# Patient Record
Sex: Male | Born: 1935 | Race: White | Hispanic: No | Marital: Married | Smoking: Never smoker
Health system: Southern US, Community
[De-identification: ages and names within clinical notes are randomized; demographics above are authoritative.]

## PROBLEM LIST (undated history)

## (undated) DIAGNOSIS — I509 Heart failure, unspecified: Secondary | ICD-10-CM

## (undated) DIAGNOSIS — F32A Depression, unspecified: Secondary | ICD-10-CM

## (undated) DIAGNOSIS — I779 Disorder of arteries and arterioles, unspecified: Secondary | ICD-10-CM

## (undated) DIAGNOSIS — T4145XA Adverse effect of unspecified anesthetic, initial encounter: Secondary | ICD-10-CM

## (undated) DIAGNOSIS — G4730 Sleep apnea, unspecified: Secondary | ICD-10-CM

## (undated) DIAGNOSIS — M1990 Unspecified osteoarthritis, unspecified site: Secondary | ICD-10-CM

## (undated) DIAGNOSIS — N2581 Secondary hyperparathyroidism of renal origin: Secondary | ICD-10-CM

## (undated) DIAGNOSIS — I10 Essential (primary) hypertension: Secondary | ICD-10-CM

## (undated) DIAGNOSIS — I350 Nonrheumatic aortic (valve) stenosis: Secondary | ICD-10-CM

## (undated) DIAGNOSIS — D509 Iron deficiency anemia, unspecified: Secondary | ICD-10-CM

## (undated) DIAGNOSIS — F329 Major depressive disorder, single episode, unspecified: Secondary | ICD-10-CM

## (undated) DIAGNOSIS — I2510 Atherosclerotic heart disease of native coronary artery without angina pectoris: Secondary | ICD-10-CM

## (undated) DIAGNOSIS — T8859XA Other complications of anesthesia, initial encounter: Secondary | ICD-10-CM

## (undated) DIAGNOSIS — J189 Pneumonia, unspecified organism: Secondary | ICD-10-CM

## (undated) DIAGNOSIS — I480 Paroxysmal atrial fibrillation: Secondary | ICD-10-CM

## (undated) DIAGNOSIS — K219 Gastro-esophageal reflux disease without esophagitis: Secondary | ICD-10-CM

## (undated) DIAGNOSIS — Z992 Dependence on renal dialysis: Secondary | ICD-10-CM

## (undated) DIAGNOSIS — N2889 Other specified disorders of kidney and ureter: Secondary | ICD-10-CM

## (undated) DIAGNOSIS — I739 Peripheral vascular disease, unspecified: Secondary | ICD-10-CM

## (undated) DIAGNOSIS — K659 Peritonitis, unspecified: Secondary | ICD-10-CM

## (undated) DIAGNOSIS — D72829 Elevated white blood cell count, unspecified: Secondary | ICD-10-CM

## (undated) DIAGNOSIS — E871 Hypo-osmolality and hyponatremia: Secondary | ICD-10-CM

## (undated) DIAGNOSIS — N186 End stage renal disease: Secondary | ICD-10-CM

## (undated) DIAGNOSIS — K224 Dyskinesia of esophagus: Secondary | ICD-10-CM

## (undated) DIAGNOSIS — I351 Nonrheumatic aortic (valve) insufficiency: Secondary | ICD-10-CM

## (undated) DIAGNOSIS — I6529 Occlusion and stenosis of unspecified carotid artery: Secondary | ICD-10-CM

## (undated) DIAGNOSIS — N200 Calculus of kidney: Secondary | ICD-10-CM

## (undated) HISTORY — DX: Gastro-esophageal reflux disease without esophagitis: K21.9

## (undated) HISTORY — DX: Major depressive disorder, single episode, unspecified: F32.9

## (undated) HISTORY — DX: End stage renal disease: N18.6

## (undated) HISTORY — DX: Essential (primary) hypertension: I10

## (undated) HISTORY — DX: Hypo-osmolality and hyponatremia: E87.1

## (undated) HISTORY — PX: OTHER SURGICAL HISTORY: SHX169

## (undated) HISTORY — DX: Elevated white blood cell count, unspecified: D72.829

## (undated) HISTORY — DX: Iron deficiency anemia, unspecified: D50.9

## (undated) HISTORY — DX: Depression, unspecified: F32.A

---

## 1999-04-27 ENCOUNTER — Encounter: Admission: RE | Admit: 1999-04-27 | Discharge: 1999-04-27 | Payer: Self-pay | Admitting: *Deleted

## 2003-01-01 ENCOUNTER — Other Ambulatory Visit: Admission: RE | Admit: 2003-01-01 | Discharge: 2003-01-01 | Payer: Self-pay | Admitting: Oncology

## 2003-02-21 ENCOUNTER — Ambulatory Visit (HOSPITAL_COMMUNITY): Admission: RE | Admit: 2003-02-21 | Discharge: 2003-02-21 | Payer: Self-pay | Admitting: Nephrology

## 2003-03-28 ENCOUNTER — Ambulatory Visit (HOSPITAL_COMMUNITY): Admission: RE | Admit: 2003-03-28 | Discharge: 2003-03-28 | Payer: Self-pay | Admitting: Nephrology

## 2003-04-28 ENCOUNTER — Ambulatory Visit (HOSPITAL_COMMUNITY): Admission: RE | Admit: 2003-04-28 | Discharge: 2003-04-28 | Payer: Self-pay | Admitting: *Deleted

## 2003-05-30 ENCOUNTER — Encounter: Admission: RE | Admit: 2003-05-30 | Discharge: 2003-05-30 | Payer: Self-pay | Admitting: General Surgery

## 2003-06-13 ENCOUNTER — Inpatient Hospital Stay (HOSPITAL_COMMUNITY): Admission: AD | Admit: 2003-06-13 | Discharge: 2003-06-18 | Payer: Self-pay | Admitting: General Surgery

## 2003-07-18 ENCOUNTER — Ambulatory Visit (HOSPITAL_COMMUNITY): Admission: RE | Admit: 2003-07-18 | Discharge: 2003-07-18 | Payer: Self-pay | Admitting: Vascular Surgery

## 2003-07-24 ENCOUNTER — Ambulatory Visit (HOSPITAL_COMMUNITY): Admission: RE | Admit: 2003-07-24 | Discharge: 2003-07-24 | Payer: Self-pay | Admitting: Cardiovascular Disease

## 2004-07-30 ENCOUNTER — Encounter: Admission: RE | Admit: 2004-07-30 | Discharge: 2004-07-30 | Payer: Self-pay | Admitting: Nephrology

## 2004-08-13 ENCOUNTER — Ambulatory Visit: Payer: Self-pay | Admitting: Internal Medicine

## 2004-08-31 ENCOUNTER — Ambulatory Visit: Payer: Self-pay | Admitting: Internal Medicine

## 2004-09-15 ENCOUNTER — Ambulatory Visit: Payer: Self-pay | Admitting: Internal Medicine

## 2004-09-23 ENCOUNTER — Encounter: Admission: RE | Admit: 2004-09-23 | Discharge: 2004-09-23 | Payer: Self-pay | Admitting: Nephrology

## 2005-02-01 ENCOUNTER — Encounter: Payer: Self-pay | Admitting: Cardiology

## 2005-02-01 ENCOUNTER — Ambulatory Visit: Payer: Self-pay

## 2005-04-29 ENCOUNTER — Ambulatory Visit: Payer: Self-pay | Admitting: Cardiovascular Disease

## 2005-12-21 ENCOUNTER — Ambulatory Visit: Payer: Self-pay | Admitting: Internal Medicine

## 2005-12-29 ENCOUNTER — Encounter: Payer: Self-pay | Admitting: Internal Medicine

## 2005-12-29 ENCOUNTER — Ambulatory Visit (HOSPITAL_COMMUNITY): Admission: RE | Admit: 2005-12-29 | Discharge: 2005-12-29 | Payer: Self-pay | Admitting: Internal Medicine

## 2006-01-02 ENCOUNTER — Ambulatory Visit: Payer: Self-pay | Admitting: Internal Medicine

## 2006-01-25 ENCOUNTER — Ambulatory Visit: Payer: Self-pay | Admitting: Internal Medicine

## 2007-05-02 ENCOUNTER — Emergency Department (HOSPITAL_COMMUNITY): Admission: EM | Admit: 2007-05-02 | Discharge: 2007-05-02 | Payer: Self-pay | Admitting: Emergency Medicine

## 2007-05-05 ENCOUNTER — Encounter: Admission: RE | Admit: 2007-05-05 | Discharge: 2007-05-05 | Payer: Self-pay | Admitting: Nephrology

## 2007-09-23 ENCOUNTER — Inpatient Hospital Stay (HOSPITAL_COMMUNITY): Admission: AD | Admit: 2007-09-23 | Discharge: 2007-09-28 | Payer: Self-pay | Admitting: Nephrology

## 2007-10-22 ENCOUNTER — Ambulatory Visit: Payer: Self-pay | Admitting: Surgery

## 2007-10-26 ENCOUNTER — Ambulatory Visit (HOSPITAL_COMMUNITY): Admission: RE | Admit: 2007-10-26 | Discharge: 2007-10-26 | Payer: Self-pay | Admitting: Nephrology

## 2007-10-29 ENCOUNTER — Emergency Department (HOSPITAL_COMMUNITY): Admission: EM | Admit: 2007-10-29 | Discharge: 2007-10-30 | Payer: Self-pay | Admitting: Emergency Medicine

## 2007-10-29 ENCOUNTER — Ambulatory Visit (HOSPITAL_COMMUNITY): Admission: RE | Admit: 2007-10-29 | Discharge: 2007-10-29 | Payer: Self-pay | Admitting: Nephrology

## 2007-10-31 ENCOUNTER — Encounter: Payer: Self-pay | Admitting: Nephrology

## 2007-11-02 ENCOUNTER — Ambulatory Visit (HOSPITAL_COMMUNITY): Admission: RE | Admit: 2007-11-02 | Discharge: 2007-11-02 | Payer: Self-pay | Admitting: Nephrology

## 2007-11-07 ENCOUNTER — Encounter: Admission: RE | Admit: 2007-11-07 | Discharge: 2007-11-07 | Payer: Self-pay | Admitting: Interventional Radiology

## 2007-11-12 ENCOUNTER — Ambulatory Visit: Payer: Self-pay | Admitting: Vascular Surgery

## 2007-11-12 ENCOUNTER — Ambulatory Visit (HOSPITAL_COMMUNITY): Admission: RE | Admit: 2007-11-12 | Discharge: 2007-11-12 | Payer: Self-pay | Admitting: Vascular Surgery

## 2007-11-16 ENCOUNTER — Ambulatory Visit: Payer: Self-pay | Admitting: Internal Medicine

## 2007-11-16 DIAGNOSIS — K224 Dyskinesia of esophagus: Secondary | ICD-10-CM

## 2007-11-26 ENCOUNTER — Encounter (INDEPENDENT_AMBULATORY_CARE_PROVIDER_SITE_OTHER): Payer: Self-pay

## 2007-11-26 ENCOUNTER — Ambulatory Visit (HOSPITAL_COMMUNITY): Admission: RE | Admit: 2007-11-26 | Discharge: 2007-11-26 | Payer: Self-pay | Admitting: Internal Medicine

## 2007-11-26 ENCOUNTER — Ambulatory Visit: Payer: Self-pay | Admitting: Internal Medicine

## 2007-11-28 ENCOUNTER — Ambulatory Visit: Payer: Self-pay | Admitting: Vascular Surgery

## 2007-12-19 ENCOUNTER — Ambulatory Visit: Payer: Self-pay | Admitting: Vascular Surgery

## 2008-01-16 ENCOUNTER — Ambulatory Visit: Payer: Self-pay | Admitting: Vascular Surgery

## 2008-02-15 ENCOUNTER — Ambulatory Visit: Payer: Self-pay | Admitting: Surgery

## 2008-02-15 ENCOUNTER — Ambulatory Visit (HOSPITAL_COMMUNITY): Admission: RE | Admit: 2008-02-15 | Discharge: 2008-02-15 | Payer: Self-pay | Admitting: Vascular Surgery

## 2008-04-07 ENCOUNTER — Emergency Department (HOSPITAL_COMMUNITY): Admission: EM | Admit: 2008-04-07 | Discharge: 2008-04-07 | Payer: Self-pay | Admitting: Emergency Medicine

## 2008-04-07 ENCOUNTER — Encounter (INDEPENDENT_AMBULATORY_CARE_PROVIDER_SITE_OTHER): Payer: Self-pay | Admitting: Emergency Medicine

## 2008-04-07 ENCOUNTER — Ambulatory Visit: Payer: Self-pay | Admitting: Vascular Surgery

## 2008-04-14 ENCOUNTER — Ambulatory Visit (HOSPITAL_COMMUNITY): Admission: RE | Admit: 2008-04-14 | Discharge: 2008-04-14 | Payer: Self-pay | Admitting: Nephrology

## 2008-05-26 ENCOUNTER — Ambulatory Visit: Payer: Self-pay | Admitting: Cardiovascular Disease

## 2008-05-26 ENCOUNTER — Inpatient Hospital Stay (HOSPITAL_COMMUNITY): Admission: EM | Admit: 2008-05-26 | Discharge: 2008-05-29 | Payer: Self-pay | Admitting: Emergency Medicine

## 2008-05-26 ENCOUNTER — Ambulatory Visit: Payer: Self-pay | Admitting: Cardiology

## 2008-05-27 ENCOUNTER — Encounter (INDEPENDENT_AMBULATORY_CARE_PROVIDER_SITE_OTHER): Payer: Self-pay | Admitting: Internal Medicine

## 2008-05-30 ENCOUNTER — Encounter: Payer: Self-pay | Admitting: Cardiology

## 2008-10-06 ENCOUNTER — Ambulatory Visit (HOSPITAL_COMMUNITY): Admission: RE | Admit: 2008-10-06 | Discharge: 2008-10-06 | Payer: Self-pay | Admitting: Nephrology

## 2008-12-22 IMAGING — CT CT HEAD W/O CM
1 series · 16 of 28 positions shown, 20 images · IV contrast (agent unspecified)
Comparison: None.

CLINICAL DATA: Dizziness.  Loss of balance.  
 HEAD CT WITHOUT CONTRAST:
TECHNIQUE: Contiguous axial images were obtained from the base of the skull through the vertex according to standard protocol without contrast.

[Series 2: head · axial · 0.49mm/px · z∈[+80,+212]mm · 16 of 28 slices shown, 20 images]
[im 2/28  brain]
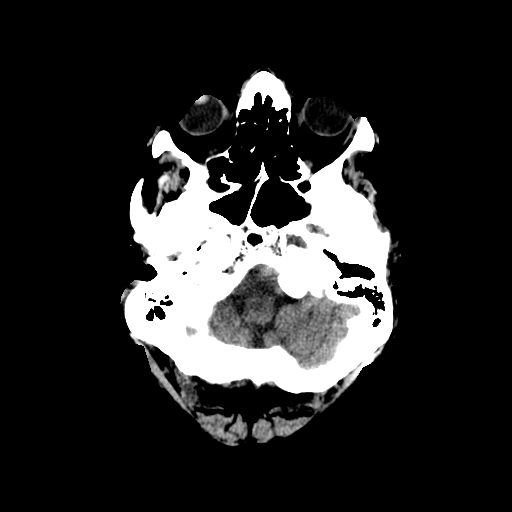
[im 2/28  bone]
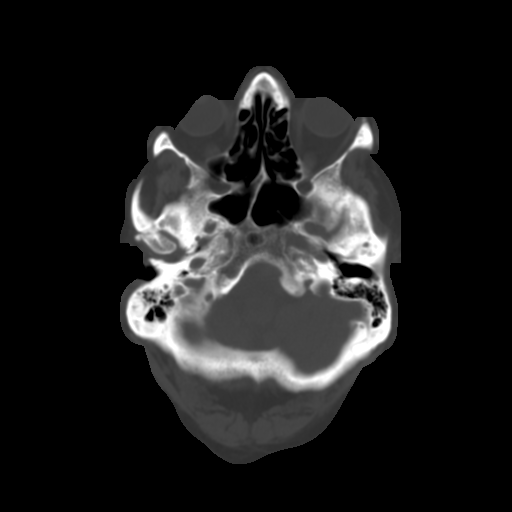
[im 4/28  brain]
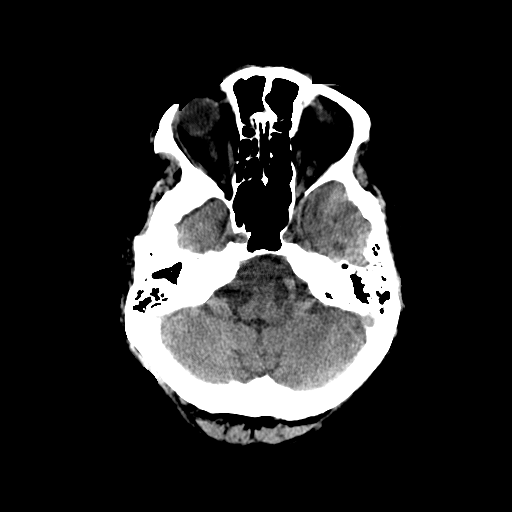
[im 6/28  brain]
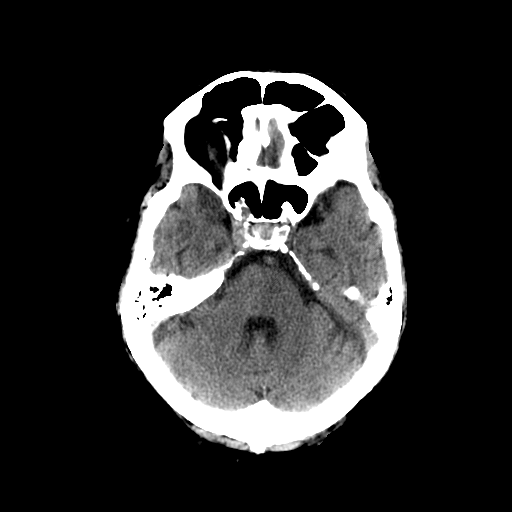
[im 7/28  brain]
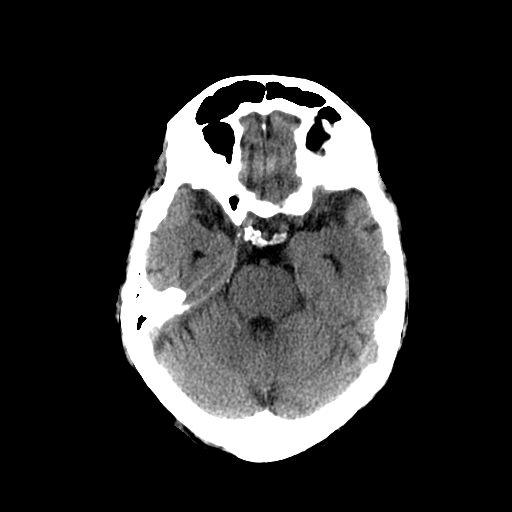
[im 9/28  brain]
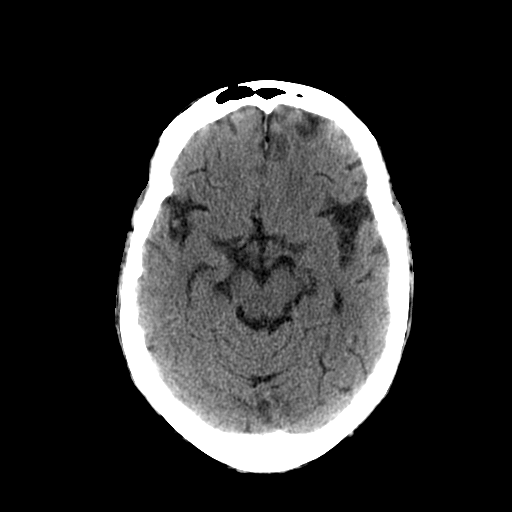
[im 9/28  bone]
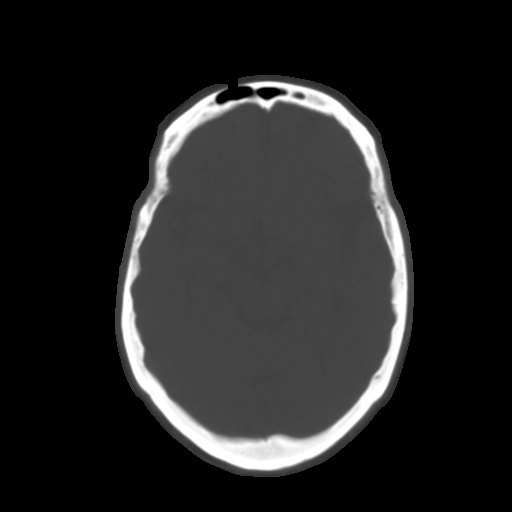
[im 10/28  brain]
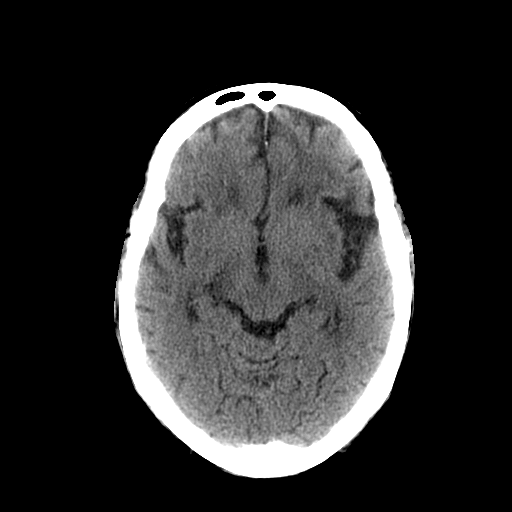
[im 12/28  brain]
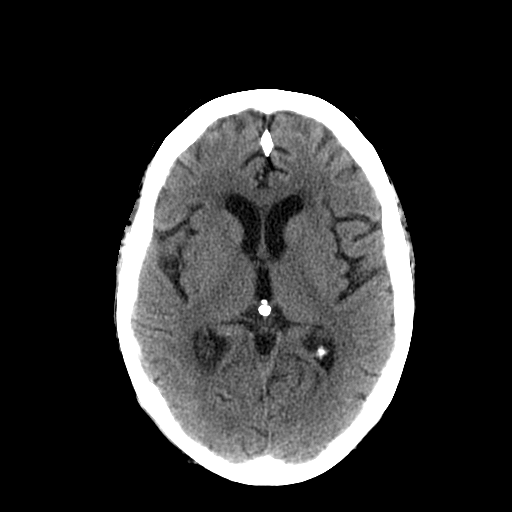
[im 14/28  brain]
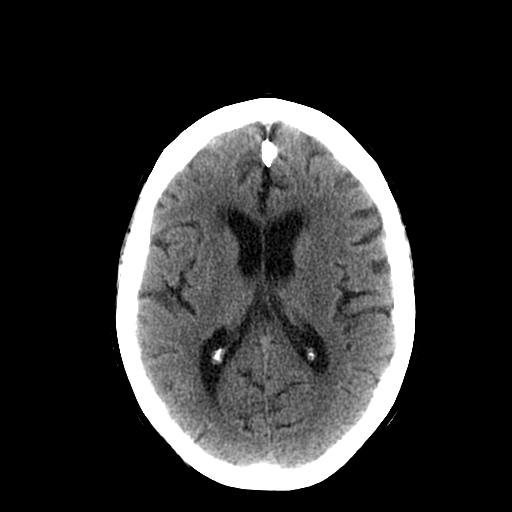
[im 15/28  brain]
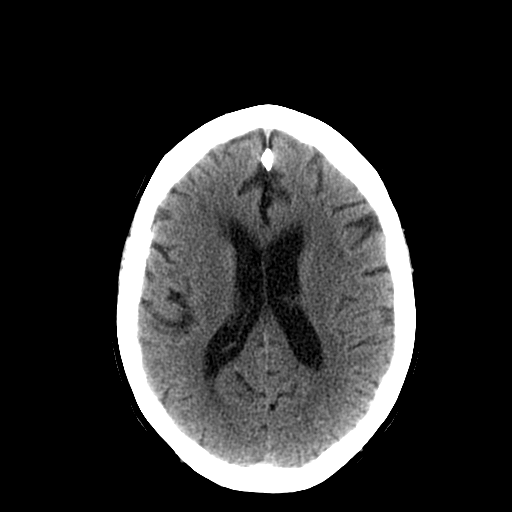
[im 15/28  bone]
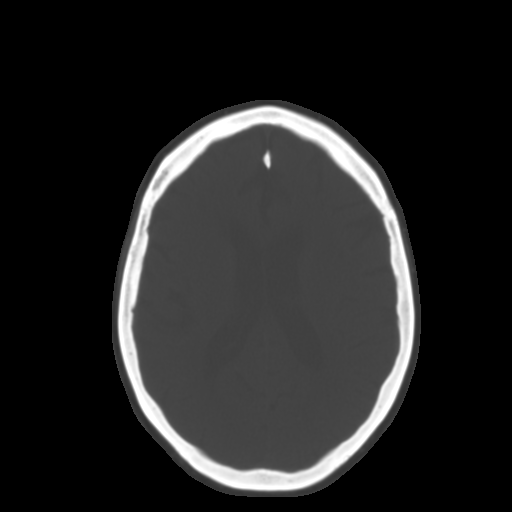
[im 17/28  brain]
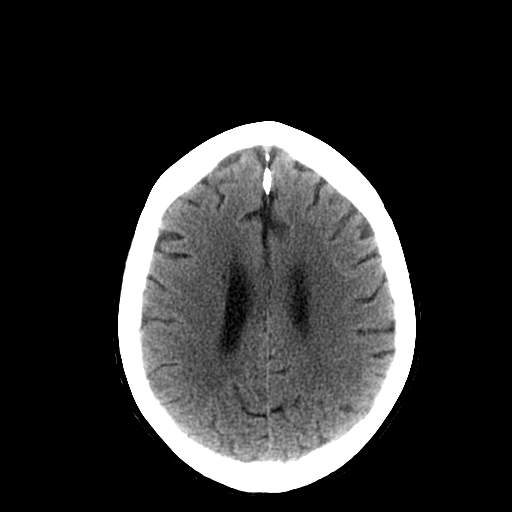
[im 19/28  brain]
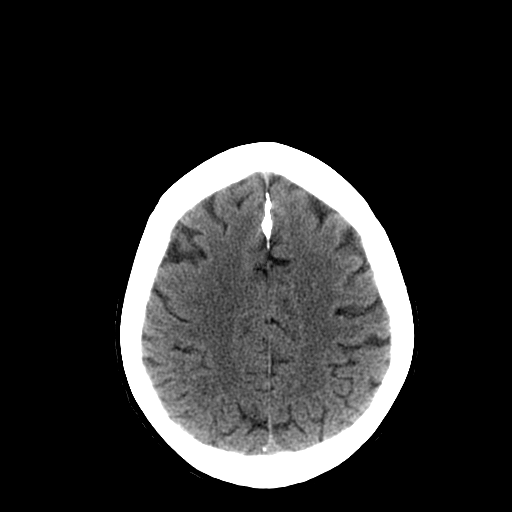
[im 20/28  brain]
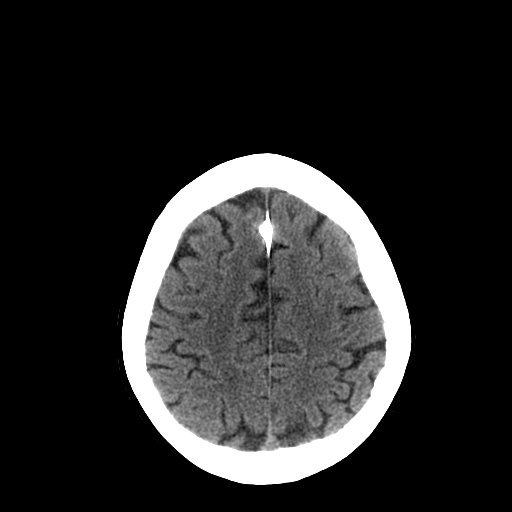
[im 22/28  brain]
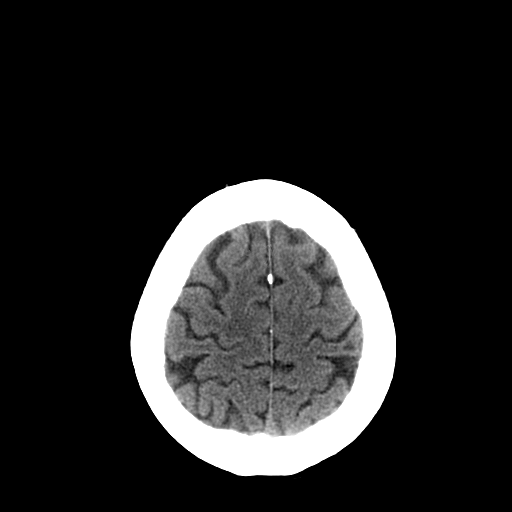
[im 22/28  bone]
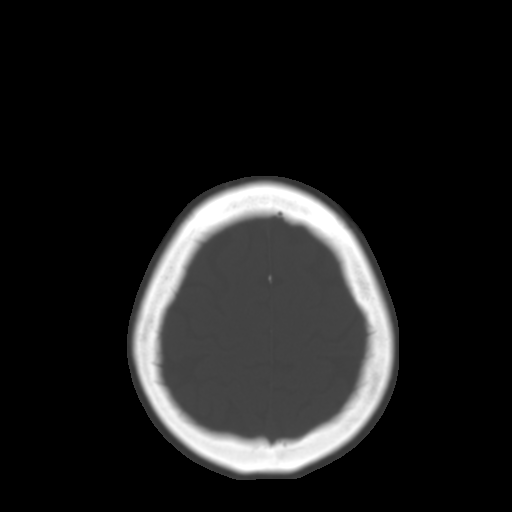
[im 23/28  brain]
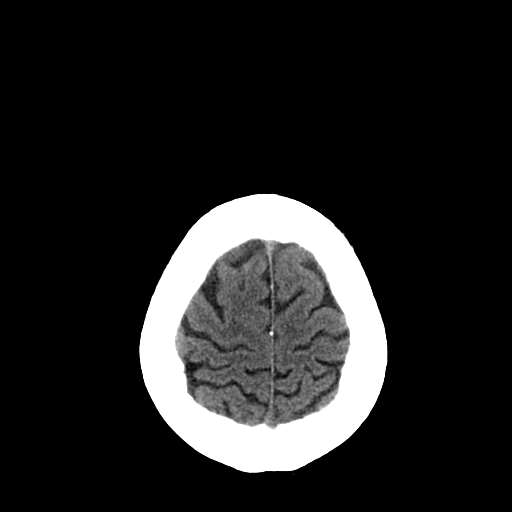
[im 25/28  brain]
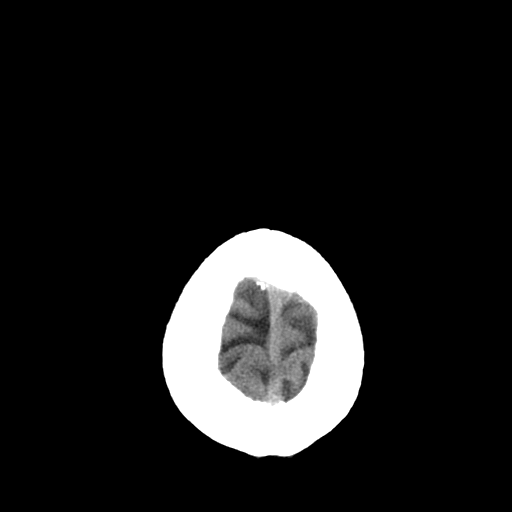
[im 27/28  brain]
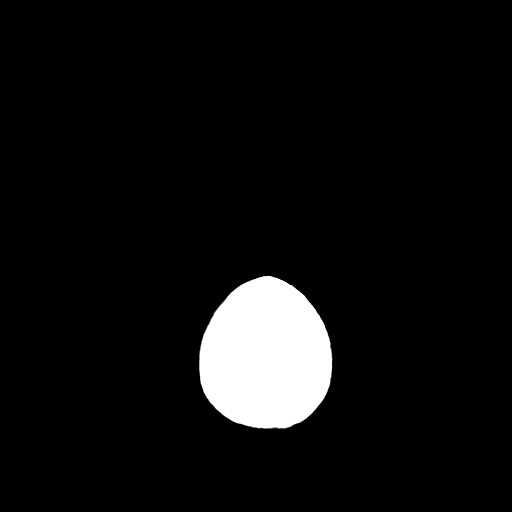

[16 of 28 positions shown; findings below may reference images not displayed]

FINDINGS: Age related atrophy with periventricular low attenuation attributed to small vessel ischemic change.  Carotid and vertebral arterial calcifications are noted.  There is no acute intracranial hemorrhage or edema.   There are no mass lesions or evidence for mass effect.  Mastoid air cells and sinuses are clear.
IMPRESSION: Vascular calcifications.  Mild atrophy with small vessel ischemic change.  No acute intracranial hemorrhage or edema.

## 2009-03-18 ENCOUNTER — Encounter: Admission: RE | Admit: 2009-03-18 | Discharge: 2009-03-18 | Payer: Self-pay | Admitting: Nephrology

## 2009-08-03 ENCOUNTER — Ambulatory Visit (HOSPITAL_COMMUNITY): Admission: RE | Admit: 2009-08-03 | Discharge: 2009-08-03 | Payer: Self-pay | Admitting: Nephrology

## 2009-09-08 ENCOUNTER — Encounter: Payer: Self-pay | Admitting: Cardiology

## 2009-09-10 ENCOUNTER — Ambulatory Visit: Payer: Self-pay | Admitting: Cardiology

## 2009-09-10 DIAGNOSIS — I4891 Unspecified atrial fibrillation: Secondary | ICD-10-CM

## 2009-09-10 DIAGNOSIS — I6529 Occlusion and stenosis of unspecified carotid artery: Secondary | ICD-10-CM

## 2009-09-10 DIAGNOSIS — I70219 Atherosclerosis of native arteries of extremities with intermittent claudication, unspecified extremity: Secondary | ICD-10-CM | POA: Insufficient documentation

## 2009-09-10 DIAGNOSIS — I5032 Chronic diastolic (congestive) heart failure: Secondary | ICD-10-CM

## 2009-09-10 DIAGNOSIS — I2510 Atherosclerotic heart disease of native coronary artery without angina pectoris: Secondary | ICD-10-CM

## 2009-09-17 ENCOUNTER — Ambulatory Visit: Payer: Self-pay | Admitting: Internal Medicine

## 2009-09-17 LAB — CONVERTED CEMR LAB: POC INR: 1

## 2009-09-22 ENCOUNTER — Encounter: Payer: Self-pay | Admitting: Cardiology

## 2009-09-22 ENCOUNTER — Telehealth: Payer: Self-pay | Admitting: Cardiology

## 2009-09-25 ENCOUNTER — Ambulatory Visit (HOSPITAL_COMMUNITY): Admission: RE | Admit: 2009-09-25 | Discharge: 2009-09-25 | Payer: Self-pay | Admitting: Cardiology

## 2009-09-25 ENCOUNTER — Encounter: Payer: Self-pay | Admitting: Cardiology

## 2009-09-25 ENCOUNTER — Ambulatory Visit: Payer: Self-pay

## 2009-09-25 ENCOUNTER — Ambulatory Visit: Payer: Self-pay | Admitting: Cardiovascular Disease

## 2009-09-25 ENCOUNTER — Ambulatory Visit: Payer: Self-pay | Admitting: Cardiology

## 2009-09-25 LAB — CONVERTED CEMR LAB: POC INR: 4.5

## 2009-10-01 ENCOUNTER — Ambulatory Visit: Payer: Self-pay | Admitting: Cardiology

## 2009-10-03 ENCOUNTER — Encounter (INDEPENDENT_AMBULATORY_CARE_PROVIDER_SITE_OTHER): Payer: Self-pay | Admitting: Pharmacist

## 2009-10-08 ENCOUNTER — Encounter: Payer: Self-pay | Admitting: Internal Medicine

## 2009-10-09 LAB — CONVERTED CEMR LAB
Ferritin: 611 ng/mL
Hemoglobin: 10.5 g/dL

## 2009-10-13 ENCOUNTER — Ambulatory Visit: Payer: Self-pay | Admitting: Cardiology

## 2009-10-15 ENCOUNTER — Encounter: Payer: Self-pay | Admitting: Internal Medicine

## 2009-10-15 LAB — CONVERTED CEMR LAB: Hemoglobin: 9.4 g/dL

## 2009-10-19 ENCOUNTER — Ambulatory Visit: Payer: Self-pay | Admitting: Cardiology

## 2009-10-19 ENCOUNTER — Telehealth: Payer: Self-pay | Admitting: Cardiology

## 2009-10-19 ENCOUNTER — Ambulatory Visit: Payer: Self-pay | Admitting: Cardiovascular Disease

## 2009-10-19 DIAGNOSIS — I10 Essential (primary) hypertension: Secondary | ICD-10-CM | POA: Insufficient documentation

## 2009-10-22 ENCOUNTER — Encounter: Payer: Self-pay | Admitting: Internal Medicine

## 2009-11-03 ENCOUNTER — Encounter: Payer: Self-pay | Admitting: Internal Medicine

## 2009-11-05 ENCOUNTER — Ambulatory Visit: Payer: Self-pay | Admitting: Internal Medicine

## 2009-11-05 ENCOUNTER — Encounter: Payer: Self-pay | Admitting: Cardiology

## 2009-11-05 ENCOUNTER — Telehealth: Payer: Self-pay | Admitting: Internal Medicine

## 2009-11-06 ENCOUNTER — Encounter (INDEPENDENT_AMBULATORY_CARE_PROVIDER_SITE_OTHER): Payer: Self-pay | Admitting: *Deleted

## 2009-11-06 ENCOUNTER — Ambulatory Visit: Payer: Self-pay | Admitting: Internal Medicine

## 2009-11-06 DIAGNOSIS — R195 Other fecal abnormalities: Secondary | ICD-10-CM

## 2009-11-06 DIAGNOSIS — D649 Anemia, unspecified: Secondary | ICD-10-CM

## 2009-11-12 ENCOUNTER — Encounter (INDEPENDENT_AMBULATORY_CARE_PROVIDER_SITE_OTHER): Payer: Self-pay | Admitting: *Deleted

## 2009-11-12 ENCOUNTER — Telehealth: Payer: Self-pay | Admitting: Internal Medicine

## 2009-11-19 ENCOUNTER — Encounter (INDEPENDENT_AMBULATORY_CARE_PROVIDER_SITE_OTHER): Payer: Self-pay | Admitting: *Deleted

## 2009-11-20 ENCOUNTER — Ambulatory Visit: Payer: Self-pay | Admitting: Internal Medicine

## 2009-11-23 ENCOUNTER — Ambulatory Visit: Payer: Self-pay | Admitting: Cardiology

## 2009-11-24 ENCOUNTER — Telehealth (INDEPENDENT_AMBULATORY_CARE_PROVIDER_SITE_OTHER): Payer: Self-pay | Admitting: *Deleted

## 2009-11-27 ENCOUNTER — Ambulatory Visit: Payer: Self-pay | Admitting: Internal Medicine

## 2009-11-27 ENCOUNTER — Ambulatory Visit (HOSPITAL_COMMUNITY): Admission: RE | Admit: 2009-11-27 | Discharge: 2009-11-27 | Payer: Self-pay | Admitting: Internal Medicine

## 2009-12-01 ENCOUNTER — Encounter: Payer: Self-pay | Admitting: Cardiology

## 2009-12-01 LAB — CONVERTED CEMR LAB
ALT: 16 units/L (ref 0–53)
AST: 17 units/L (ref 0–37)
Albumin: 3.5 g/dL (ref 3.5–5.2)
Alkaline Phosphatase: 87 units/L (ref 39–117)
Cholesterol: 82 mg/dL (ref 0–200)
HDL: 22.1 mg/dL — ABNORMAL LOW (ref >39.00)
POC INR: 1.74
Total Protein: 6.1 g/dL (ref 6.0–8.3)
Triglycerides: 89 mg/dL (ref 0.0–149.0)

## 2009-12-03 ENCOUNTER — Encounter: Payer: Self-pay | Admitting: Cardiology

## 2009-12-04 ENCOUNTER — Encounter: Payer: Self-pay | Admitting: Cardiology

## 2009-12-14 ENCOUNTER — Encounter: Payer: Self-pay | Admitting: Cardiology

## 2009-12-14 ENCOUNTER — Encounter: Payer: Self-pay | Admitting: Internal Medicine

## 2009-12-14 LAB — CONVERTED CEMR LAB: POC INR: 2.41

## 2010-01-11 ENCOUNTER — Encounter: Payer: Self-pay | Admitting: Cardiology

## 2010-02-08 ENCOUNTER — Encounter: Payer: Self-pay | Admitting: Internal Medicine

## 2010-02-08 ENCOUNTER — Encounter: Payer: Self-pay | Admitting: Cardiology

## 2010-02-08 LAB — CONVERTED CEMR LAB
POC INR: 3.09
Prothrombin Time: 29.8 s

## 2010-02-24 ENCOUNTER — Encounter: Payer: Self-pay | Admitting: Cardiology

## 2010-03-11 ENCOUNTER — Encounter: Payer: Self-pay | Admitting: Cardiovascular Disease

## 2010-03-11 ENCOUNTER — Encounter: Payer: Self-pay | Admitting: Cardiology

## 2010-03-16 ENCOUNTER — Encounter: Payer: Self-pay | Admitting: Cardiovascular Disease

## 2010-03-29 ENCOUNTER — Ambulatory Visit
Admission: RE | Admit: 2010-03-29 | Discharge: 2010-03-29 | Payer: Self-pay | Source: Home / Self Care | Attending: Cardiology | Admitting: Cardiology

## 2010-03-29 ENCOUNTER — Encounter: Payer: Self-pay | Admitting: Cardiology

## 2010-03-29 DIAGNOSIS — E785 Hyperlipidemia, unspecified: Secondary | ICD-10-CM | POA: Insufficient documentation

## 2010-03-30 ENCOUNTER — Encounter: Payer: Self-pay | Admitting: Cardiology

## 2010-03-30 LAB — CONVERTED CEMR LAB
POC INR: 3.26
Prothrombin Time: 31 s

## 2010-04-01 ENCOUNTER — Encounter: Payer: Self-pay | Admitting: Cardiology

## 2010-04-04 ENCOUNTER — Encounter: Payer: Self-pay | Admitting: Nephrology

## 2010-04-05 ENCOUNTER — Encounter: Payer: Self-pay | Admitting: Nephrology

## 2010-04-11 LAB — CONVERTED CEMR LAB
ALT: 16 units/L (ref 0–53)
Albumin: 3.8 g/dL (ref 3.5–5.2)
Alkaline Phosphatase: 109 units/L (ref 39–117)
Basophils Absolute: 0 10*3/uL (ref 0.0–0.1)
Bilirubin, Direct: 0.1 mg/dL (ref 0.0–0.3)
CO2: 31 meq/L (ref 19–32)
Calcium: 9.6 mg/dL (ref 8.4–10.5)
Chloride: 98 meq/L (ref 96–112)
Creatinine, Ser: 5.3 mg/dL (ref 0.4–1.5)
Eosinophils Relative: 8.6 % — ABNORMAL HIGH (ref 0.0–5.0)
Glucose, Bld: 94 mg/dL (ref 70–99)
HCT: 34.3 % — ABNORMAL LOW (ref 39.0–52.0)
HDL: 32.5 mg/dL — ABNORMAL LOW (ref >39.00)
Hemoglobin: 11.6 g/dL — ABNORMAL LOW (ref 13.0–17.0)
Lymphocytes Relative: 14.3 % (ref 12.0–46.0)
Lymphs Abs: 1.5 10*3/uL (ref 0.7–4.0)
Monocytes Relative: 8 % (ref 3.0–12.0)
Neutro Abs: 7.1 10*3/uL (ref 1.4–7.7)
Prothrombin Time: 10 s (ref 9.7–11.8)
Sodium: 139 meq/L (ref 135–145)
Total CHOL/HDL Ratio: 4
Total Protein: 6.8 g/dL (ref 6.0–8.3)
WBC: 10.3 10*3/uL (ref 4.5–10.5)

## 2010-04-13 NOTE — Medication Information (Signed)
Summary: Physician's Orders   Physician's Orders   Imported By: Roderic Ovens 01/20/2010 10:25:18  _____________________________________________________________________  External Attachment:    Type:   Image     Comment:   External Document

## 2010-04-13 NOTE — Procedures (Signed)
Summary: Upper Endoscopy  Patient: [name] Note: All result statuses are Final unless otherwise noted.  Tests: (1) Upper Endoscopy (EGD)   EGD Upper Endoscopy       DONE     [facility]     [address] [place], [place]  [address]           ENDOSCOPY PROCEDURE REPORT           PATIENT:  Casey [name], Casey [name]  MR#:  [id number]     BIRTHDATE:  08-07-1931, 78 yrs. old  GENDER:  male           ENDOSCOPIST:  Iva Boop, MD, [facility]           PROCEDURE DATE:  11/27/2009     PROCEDURE:  EGD with biopsy     ASA CLASS:  Class III     INDICATIONS:  hemeoccult positive stool, anemia           MEDICATIONS:   Fentanyl 50 mcg, Versed 5 mg IV     TOPICAL ANESTHETIC:  Cetacaine Spray           DESCRIPTION OF PROCEDURE:   After the risks benefits and     alternatives of the procedure were thoroughly explained, informed     consent was obtained.  The  endoscope was introduced through the     mouth and advanced to the second portion of the duodenum, without     limitations.  The instrument was slowly withdrawn as the mucosa     was fully examined.     <<PROCEDUREIMAGES>>           An erosion was found in the second portion of the duodenum.     Duodenitis was found in the bulb of the duodenum. Irregular mucosa     Multiple biopsies were obtained and sent to pathology.  Otherwise     the examination was normal.    Retroflexed views revealed no     abnormalities.    The scope was then withdrawn from the patient     and the procedure completed.           COMPLICATIONS:  None           ENDOSCOPIC IMPRESSION:     1) Erosion in the second portion duodenum - bx     2) Duodenitis in the bulb of duodenum     3) Otherwise normal examination     RECOMMENDATIONS:     Colonoscopy Next           Iva Boop, MD, [facility]           CC:  Terrial Rhodes, MD     The Patient           n.     eSIGNED:   Iva Boop at 11/27/2009 09:38 AM           Harl Bowie,  [id number]  Note: An exclamation mark (!) indicates a result that was not dispersed into the flowsheet. Document Creation Date: 11/27/2009 9:38 AM _______________________________________________________________________  (1) Order result status: Final Collection or observation date-time: 11/27/2009 09:10 Requested date-time:  Receipt date-time:  Reported date-time:  Referring Physician:   Ordering Physician: Stan Head [phone]) Specimen Source:  Source: Launa Grill Order Number: [id number] Lab site:

## 2010-04-13 NOTE — Medication Information (Signed)
Summary: Coumadin/Warfarin  Coumadin/Warfarin   Imported By: Marylou Mccoy 10/08/2009 09:54:29  _____________________________________________________________________  External Attachment:    Type:   Image     Comment:   External Document

## 2010-04-13 NOTE — Medication Information (Signed)
Summary: Lab Orders  Lab Orders   Imported By: Marylou Mccoy 12/17/2009 14:28:03  _____________________________________________________________________  External Attachment:    Type:   Image     Comment:   External Document

## 2010-04-13 NOTE — Letter (Signed)
Summary: Moviprep Instructions  [facility]  [address]   [place], [place] [address]   Phone: [phone]  Fax: [phone]       Casey [name]    [birth year]    MRN: [id number]        Procedure Day Dorna Bloom: Friday, 11-27-09     Arrival Time: 7:45 a.m.     Procedure Time: 8:45 a.m.     Location of Procedure:                    x [facility] ( Outpatient Registration)                        PREPARATION FOR COLONOSCOPY/ ENDOSCOPY WITH MOVIPREP   Starting 5 days prior to your procedure 11-22-09 do not eat nuts, seeds, popcorn, corn, beans, peas,  salads, or any raw vegetables.  Do not take any fiber supplements (e.g. Metamucil, Citrucel, and Benefiber).  THE DAY BEFORE YOUR PROCEDURE         DATE: 11-26-09  DAY: Thursday  1.  Drink clear liquids the entire day-NO SOLID FOOD  2.  Do not drink anything colored red or purple.  Avoid juices with pulp.  No orange juice.  3.  Drink at least 64 oz. (8 glasses) of fluid/clear liquids during the day to prevent dehydration and help the prep work efficiently.  CLEAR LIQUIDS INCLUDE: Water Jello Ice Popsicles Tea (sugar ok, no milk/cream) Powdered fruit flavored drinks Coffee (sugar ok, no milk/cream) Gatorade Juice: apple, white grape, white cranberry  Lemonade Clear bullion, consomm, broth Carbonated beverages (any kind) Strained chicken noodle soup Hard Candy                             4.  In the morning, mix first dose of MoviPrep solution:    Empty 1 Pouch A and 1 Pouch B into the disposable container    Add lukewarm drinking water to the top line of the container. Mix to dissolve    Refrigerate (mixed solution should be used within 24 hrs)  5.  Begin drinking the prep at 5:00 p.m. The MoviPrep container is divided by 4 marks.   Every 15 minutes drink the solution down to the next mark (approximately 8 oz) until the full liter is complete.   6.  Follow completed prep with 16 oz of clear  liquid of your choice (Nothing red or purple).  Continue to drink clear liquids until bedtime.  7.  Before going to bed, mix second dose of MoviPrep solution:    Empty 1 Pouch A and 1 Pouch B into the disposable container    Add lukewarm drinking water to the top line of the container. Mix to dissolve    Refrigerate  THE DAY OF YOUR PROCEDURE      DATE: 11-27-09  DAY: Friday  Beginning at 3:45 a.m. (5 hours before procedure):         1. Every 15 minutes, drink the solution down to the next mark (approx 8 oz) until the full liter is complete.  2. Follow completed prep with 16 oz. of clear liquid of your choice.    3. You may drink clear liquids until  4:45 a.m.  (4 HOURS BEFORE PROCEDURE).   MEDICATION INSTRUCTIONS  Unless otherwise instructed, you should take regular prescription medications with a small sip of water   as early as possible  the morning of your procedure.  Diabetic patients - see separate instructions.  Stop taking Coumadin on  11-22-09   (5 days before procedure).           OTHER INSTRUCTIONS  You will need a responsible adult at least 75 years of age to accompany you and drive you home.   This person must remain in the waiting room during your procedure.  Wear loose fitting clothing that is easily removed.  Leave jewelry and other valuables at home.  However, you may wish to bring a book to read or  an iPod/MP3 player to listen to music as you wait for your procedure to start.  Remove all body piercing jewelry and leave at home.  Total time from sign-in until discharge is approximately 2-3 hours.  You should go home directly after your procedure and rest.  You can resume normal activities the  day after your procedure.  The day of your procedure you should not:   Drive   Make legal decisions   Operate machinery   Drink alcohol   Return to work  You will receive specific instructions about eating, activities and medications before you  leave.    The above instructions have been reviewed and explained to me by   Durwin Glaze RN  November 20, 2009 12:55 PM    I fully understand and can verbalize these instructions _____________________________ Date _________

## 2010-04-13 NOTE — Medication Information (Signed)
Summary: Coumadin Clinic  Anticoagulant Therapy  Managed by: Bethena Midget, RN, BSN Referring MD: Shirlee Latch PCP: Terrial Rhodes, MD Supervising MD: Tenny Craw MD, Gunnar Fusi Indication 1: Atrial Fibrillation Lab Used: [facility] Site: [address] PT 29.8 INR POC 3.09 INR RANGE 2.0-3.0  Dietary changes: yes       Details: Not eating any leafy veggies  Health status changes: no    Bleeding/hemorrhagic complications: no    Recent/future hospitalizations: no    Any changes in medication regimen? no    Recent/future dental: no  Any missed doses?: no       Is patient compliant with meds? yes      Comments: Lab drawn on 11/24.  Received on 11/28.   Allergies: No Known Drug Allergies  Anticoagulation Management History:      His anticoagulation is being managed by telephone today.  Positive risk factors for bleeding include an age of 75 years or older and presence of serious comorbidities.  The bleeding index is 'intermediate risk'.  Positive CHADS2 values include History of CHF, History of HTN, and Age > 62 years old.  His last INR was 0.9 ratio.  Prothrombin time is 29.8.  Anticoagulation responsible provider: Tenny Craw MD, Gunnar Fusi.  INR POC: 3.09.    Anticoagulation Management Assessment/Plan:      The patient's current anticoagulation dose is Warfarin sodium 5 mg tabs: take as directed.  The target INR is 2.0-3.0.  The next INR is due 02/27/2010.  Anticoagulation instructions were given to spouse.  Results were reviewed/authorized by Bethena Midget, RN, BSN.  He was notified by Bethena Midget, RN, BSN.         Prior Anticoagulation Instructions: INR 2.85  Attempted to call pt with results.  LMOM TCB. Cloyde Reams RN  January 11, 2010 2:30 PM  Spoke with pt's wife, advised to continue on same dosage 5mg  daily.  Recheck in 4 weeks.  Orders faxed to HD.   Current Anticoagulation Instructions: INR 3.09 Today only take 2.5mg s then resume 5mg  daily. Recheck in 3 weeks. Orders sent to HD.

## 2010-04-13 NOTE — Assessment & Plan Note (Signed)
Summary: NPH/AFIB/JSS   Referring Provider:  Dr. Arrie Aran Primary Provider:  Aida Puffer, M.D.   History of Present Illness: 77 yo with history of vascular disease, ESRD, and diastolic CHF presents for evaluation of new-onset atrial fibrillation.  Patient was noted to have atrial fibrillation earlier this week at dialysis.  Heart rate appears to have gotten up as high as the 150s.  He is actually back in NSR today.  He did not know he was in atrial fibrillation (no palpitations, etc).  He has never noted palpitations or irregular heart rhythm in the past.  This week's episode was the only time atrial fibrillation has been documented.  I asked Dr. Arrie Aran to start him on Toprol XL 25 mg daily.  He has sent it off to be filled but it will not be back for about a week.   Patient has had generalized fatigue for years.  He sleeps a lot and is not very active.  He has been short of breath after walking about 50 feet or a flight of steps for years.  No orthopnea but seems to have occasional episodes of PND.  No chest pain.  BP is high today at 160/75.   Patient also reports heaviness in his feet and lower legs when he walks.    ECG: NSR at 70  Current Medications (verified): 1)  Omeprazole 20 Mg Tbec (Omeprazole) .... Take 1 Tablet By Mouth Two Times A Day 2)  Nephro-Vite Rx 1 Mg Tabs (B Complex-C-Folic Acid) .... Take 1 Tablet By Mouth Once A Day 3)  Hydrocodone-Acetaminophen 7.5-500 Mg/58ml Soln (Hydrocodone-Acetaminophen) .... As Needed Pain 4)  Paroxetine Hcl 20 Mg Tabs (Paroxetine Hcl) .... Take One Tablet Once Daily 5)  Fosinopril Sodium 10 Mg Tabs (Fosinopril Sodium) .... Take One Tablet Once Daily 6)  Stool Softener 100 Mg Caps (Docusate Sodium) .... Take 2 Tablets Once Daily 7)  Complete Allergy 25 Mg Caps (Diphenhydramine Hcl) .... As Needed For Allergies 8)  Toprol Xl 25 Mg Xr24h-Tab (Metoprolol Succinate) .... Take One Tablet Two Times A Day 9)  Aspirin 81 Mg Tabs (Aspirin) ....  Take One Tablet Once Daily  Allergies (verified): No Known Drug Allergies  Past History:  Past Surgical History: Last updated: 11/16/2007 Hemorrhoidectomy Tenckhoff catheter AV fistula Esophageal dilation 10/07  Family History: Last updated: 09/10/2009 CVA- mother and father HTN, DM  Social History: Last updated: 09/10/2009 Married, 1 boy, 2 girls. Lives in [place].  Patient has never smoked.  Alcohol Use - no  Past Medical History: 1. End-stage renal disease (PD now HD T, R, Sa).  Had MRSE peritonitis with PD, necessitating change to HD.  2. Depression 3. Hypertension 4. Nephrolithiasis 5. Esophageal dysmotility 6. GERD, small hiatal hernia, h/o peptic stricture.  7. Renal vascular disease: Occluded renal arteries bilaterally on cath 3/10.  8. CAD: Nonobstructive.  LHC (3/10) with 30% ostial RCA, 30% ostial LAD, 40% ostial D1.  EF 50%.  9. Diastolic CHF: Chronic exertional dyspnea.  Echo (3/10): EF 50-55%, mild LV hypertrophy, grade II diastolic dysfunction, mild AI, mild-moderate MR 10. Paroxysmal atrial fibrillation: Noted first in 6/11.   Family History: CVA- mother and father HTN, DM  Social History: Married, 1 boy, 2 girls. Lives in [place].  Patient has never smoked.  Alcohol Use - no  Review of Systems       All systems reviewed and negative except as per HPI.   Vital Signs:  Patient profile:   77 year old male Height:  68 inches Weight:      194 pounds BMI:     29.60 Pulse rate:   70 / minute Pulse rhythm:   regular BP sitting:   160 / 75  (left arm)  Vitals Entered By: Judithe Modest CMA (September 10, 2009 8:41 AM)  Physical Exam  General:  Well developed, well nourished, in no acute distress. Obese.  Head:  normocephalic and atraumatic Nose:  no deformity, discharge, inflammation, or lesions Mouth:  Teeth, gums and palate normal. Oral mucosa normal. Neck:  Neck supple, no JVD. No masses, thyromegaly or abnormal cervical nodes. Lungs:   Clear bilaterally to auscultation and percussion. Heart:  Non-displaced PMI, chest non-tender; regular rate and rhythm, S1, S2 without rubs or gallops. 2/6 early systolic ejection-type murmur.  Bilateral carotid bruits.  Unable to palpate pedal pulses. No edema, no varicosities. Abdomen:  Bowel sounds positive; abdomen soft and non-tender without masses, organomegaly, or hernias noted. No hepatosplenomegaly. Extremities:  No clubbing or cyanosis. Neurologic:  Alert and oriented x 3. Skin:  Intact without lesions or rashes. Psych:  Normal affect.   Impression & Recommendations:  Problem # 1:  ATHEROSLERO NATV ART EXTREM W/INTERMIT CLAUDICAT (ICD-440.21) I am unable to feel Casey [name]'s pedal pulses and he has symptoms consistent with claudication.  I will get peripheral arterial dopplers.  He has known vascular disease (renal artery stenosis).   Problem # 2:  CAROTID ARTERY STENOSIS (ICD-433.10) Bilateral carotid bruits.  Will get carotid dopplers.   Problem # 3:  ATRIAL FIBRILLATION (ICD-427.31) Paroxysmal atrial fibrillation.  Patient is back in NSR today.  He did not notice anything different when he was in atrial fibrillation so I am uncertain how often he has episodes.  He has significant stroke risk and does not have a history of falls, so I will start him on coumadin today.  He will start Toprol XL 25 mg two times a day.  I will go ahead and give him a prescription for 2 wks until his mail order meds come in.  I will get an echo to assess LV function given new-onset atrial fib. Decrease ASA to 81 mg daily after starting coumadin.   Problem # 4:  CAD, NATIVE VESSEL (ICD-414.01) Patient has known vascular disease with nonobstructive CAD and renal artery stenosis.  He will continue ASA 81 mg daily.  He needs to be on a statin, the question will be what dose.  I will have him get lipids today (he has been fasting).   Problem # 5:  DIASTOLIC HEART FAILURE, CHRONIC (ICD-428.32) Patient does  not seem particularly volume overloaded today.  His volume is controlled by dialysis.  He has been chronically short of breath for years.   Other Orders: TLB-CBC Platelet - w/Differential (85025-CBCD) TLB-BMP (Basic Metabolic Panel-BMET) (80048-METABOL) TLB-Hepatic/Liver Function Pnl (80076-HEPATIC) TLB-Lipid Panel (80061-LIPID) TLB-PT (Protime) (85610-PTP) Carotid Duplex (Carotid Duplex) Arterial Duplex Lower Extremity (Arterial Duplex Low) Echocardiogram (Echo)  Patient Instructions: 1)  Your physician has recommended you make the following change in your medication:  2)  Decrease Aspirin to 81mg  daily-- 3)  Start Coumadin(warfarin) 5mg  daily 4)  Take Toprol(metoprolol) XL 25mg  twice a day 5)  Appointment in [facility]madin Clinic ([facility]) Thursday July 7 at 9:45--this is important since you are starting Coumadin 6)  Your physician recommends that you have lab today---BMP/LIPID/LIVER/CBC/INR  7)  Your physician has requested that you have a carotid duplex. This test is an ultrasound of the carotid arteries in your neck. It looks at blood  flow through these arteries that supply the brain with blood. Allow one hour for this exam. There are no restrictions or special instructions. 8)  Your physician has requested that you have a lower or upper extremity arterial duplex.  This test is an ultrasound of the arteries in the legs or arms.  It looks at arterial blood flow in the legs and arms.  Allow one hour for Lower and Upper Arterial scans. There are no restrictions or special instructions. 9)  Your physician has requested that you have an echocardiogram.  Echocardiography is a painless test that uses sound waves to create images of your heart. It provides your doctor with information about the size and shape of your heart and how well your heart's chambers and valves are working.  This procedure takes approximately one hour. There are no restrictions for this procedure. 10)  Your physician recommends that  you schedule a follow-up appointment in: 1 month with Dr Shirlee Latch. Prescriptions: WARFARIN SODIUM 5 MG TABS (WARFARIN SODIUM) one daily or as directed  #30 x 0   Entered by:   Katina Dung, RN, BSN   Authorized by:   Marca Ancona, MD   Signed by:   Katina Dung, RN, BSN on 09/10/2009   Method used:   Electronically to        [facility]* (retail)       [address] [place], [place]  [address]       Ph: [phone] or [phone]       Fax: [phone]   RxID:   [id number] TOPROL XL 25 MG XR24H-TAB (METOPROLOL SUCCINATE) take one tablet two times a day  #60 x 0   Entered by:   Judithe Modest CMA   Authorized by:   Marca Ancona, MD   Signed by:   Judithe Modest CMA on 09/10/2009   Method used:   Electronically to        [facility]* (retail)       [address] [place], [place]  [address]       Ph: [phone] or [phone]       Fax: [phone]   RxID:   [id number]

## 2010-04-13 NOTE — Medication Information (Signed)
Summary: Coumadin Clinic  Anticoagulant Therapy  Managed by: Cloyde Reams, RN, BSN Referring MD: Shirlee Latch PCP: Terrial Rhodes, MD Supervising MD: Gala Romney MD, Reuel Boom Indication 1: Atrial Fibrillation Lab Used: [facility] Site: [address] PT 24.6 INR POC 2.41 INR RANGE 2.0-3.0  Dietary changes: no    Health status changes: no    Bleeding/hemorrhagic complications: no    Recent/future hospitalizations: no    Any changes in medication regimen? yes       Details: Incr omeprazole 40mg  daily.  Recent/future dental: no  Any missed doses?: no       Is patient compliant with meds? yes       Allergies: No Known Drug Allergies  Anticoagulation Management History:      His anticoagulation is being managed by telephone today.  Positive risk factors for bleeding include an age of 74 years or older and presence of serious comorbidities.  The bleeding index is 'intermediate risk'.  Positive CHADS2 values include History of CHF, History of HTN, and Age > 18 years old.  His last INR was 0.9 ratio.  Prothrombin time is 24.6.  Anticoagulation responsible provider: Bensimhon MD, Reuel Boom.  INR POC: 2.41.  Exp: 12/2010.    Anticoagulation Management Assessment/Plan:      The patient's current anticoagulation dose is Warfarin sodium 5 mg tabs: take as directed.  The target INR is 2.0-3.0.  The next INR is due 01/09/2010.  Anticoagulation instructions were given to patient/spouse.  Results were reviewed/authorized by Cloyde Reams, RN, BSN.  He was notified by Cloyde Reams RN.         Prior Anticoagulation Instructions: INR 1.74  Called spoke with pt's wife, pt has been off coumadin x 5 days for colonoscopy. Continue on same dosage 5mg  daily.  Recheck on 12/10/09 if good return to q4wk f/u.  Orders faxed to HD. Cloyde Reams RN  December 04, 2009 11:42 AM   Current Anticoagulation Instructions: INR 2.41  Called spoke with pt's wife, advised to continue on same dosage 5mg  daily.   Recheck in 4 weeks.  Orders faxed to HD.

## 2010-04-13 NOTE — Letter (Signed)
Summary: Anticoagulation Modification Letter  [facility]  [address] [place], [place] [address]   Phone: [phone]  Fax: [phone]    November 06, 2009  Re:    Casey [name] Rigaud DOB:    01/03/1932 MRN:  [id number]    Dear Dr. Shirlee Latch:  We will be scheduling the above patient for an endoscopic procedure with Dr. Leone Payor. Our records show that he is on anticoagulation therapy. Please advise as to how long the patient may come off their therapy of Coumadin prior to his scheduled procedures.  Please append and route this completed form to Casey [name], CMA (AAMA).  Thank you for your help with this matter.  Sincerely,  Casey [name] CMA Duncan Dull)   Physician Recommendation:  Hold Coumadin 5 days prior ____________  Other ______________________________     Appended Document: Anticoagulation Modification Letter OK to hold warfarin 5 days prior to endoscopy and start back afterwards. No prior history of CVA so do not have to bridge with Lovenox.

## 2010-04-13 NOTE — Progress Notes (Signed)
Summary: prep ?  Phone Note Call from Patient Call back at Home Phone [phone]   Caller: wife, nancy Call For: Dr. Leone Payor Reason for Call: Talk to Nurse Complaint: Cough/Sore throat Summary of Call: prep ?'s... is it ok for pt to drink Boost High Protein up until day before procedure... please leave a detailed mesg with answer if pt not home Initial call taken by: Vallarie Mare,  November 24, 2009 10:06 AM  Follow-up for Phone Call        Left meessage for pt: ok to drink Boost up until day before procedure.  Only clear liquids day before procedure. Follow-up by: Ezra Sites RN,  November 24, 2009 11:41 AM

## 2010-04-13 NOTE — Medication Information (Signed)
Summary: [facility]  Anticoagulant Therapy  Managed by: Cloyde Reams, RN, BSN Referring MD: Shirlee Latch PCP: Terrial Rhodes, MD Supervising MD: Shirlee Latch MD, Dalton Indication 1: Atrial Fibrillation Lab Used: [facility] [place] Site: [address] PT 28.0 INR POC 2.85 INR RANGE 2.0-3.0    Bleeding/hemorrhagic complications: no     Any changes in medication regimen? no     Any missed doses?: no       Is patient compliant with meds? yes       Allergies: No Known Drug Allergies  Anticoagulation Management History:      His anticoagulation is being managed by telephone today.  Positive risk factors for bleeding include an age of 75 years or older and presence of serious comorbidities.  The bleeding index is 'intermediate risk'.  Positive CHADS2 values include History of CHF, History of HTN, and Age > 75 years old.  His last INR was 0.9 ratio.  Prothrombin time is 28.0.  Anticoagulation responsible provider: Shirlee Latch MD, Dalton.  INR POC: 2.85.  Exp: 12/2010.    Anticoagulation Management Assessment/Plan:      The patient's current anticoagulation dose is Warfarin sodium 5 mg tabs: take as directed.  The target INR is 2.0-3.0.  The next INR is due 02/06/2010.  Anticoagulation instructions were given to patient/spouse.  Results were reviewed/authorized by Cloyde Reams, RN, BSN.  He was notified by Cloyde Reams RN.         Prior Anticoagulation Instructions: INR 2.41  Called spoke with pt's wife, advised to continue on same dosage 5mg  daily.  Recheck in 4 weeks.  Orders faxed to HD.   Current Anticoagulation Instructions: INR 2.85  Attempted to call pt with results.  LMOM TCB. Cloyde Reams RN  January 11, 2010 2:30 PM  Spoke with pt's wife, advised to continue on same dosage 5mg  daily.  Recheck in 4 weeks.  Orders faxed to HD.

## 2010-04-13 NOTE — Progress Notes (Signed)
Summary: needs to change qty on med list  Phone Note Call from Patient   Caller: Patient Reason for Call: Talk to Nurse Summary of Call: pt's wife calling to let us know that his omeprazole on his med list should be once a day not twice a day Initial call taken by: Glynda Jaeger,  October 19, 2009 1:27 PM  Follow-up for Phone Call        talked with wife --pt only taking Omeprazole 20mg  daily

## 2010-04-13 NOTE — Medication Information (Signed)
Summary: rov/ln  Anticoagulant Therapy  Managed by: Cloyde Reams, RN, BSN Referring MD: Shirlee Latch PCP: Aida Puffer, M.D. Supervising MD: Myrtis Ser MD, Tinnie Gens Indication 1: Atrial Fibrillation Lab Used: [facility] [place] Site: [address] INR POC 4.3 INR RANGE 2.0-3.0  Dietary changes: no    Health status changes: no    Bleeding/hemorrhagic complications: no    Recent/future hospitalizations: no    Any changes in medication regimen? yes       Details: Started on Pravastatin 40mg  daily.  Recent/future dental: no  Any missed doses?: no       Is patient compliant with meds? yes      Comments: Pt's wife states she has been giving 5mg  daily except 7.5mg  on Tu,Th,Sa  Allergies: No Known Drug Allergies  Anticoagulation Management History:      The patient is taking warfarin and comes in today for a routine follow up visit.  Positive risk factors for bleeding include an age of 19 years or older and presence of serious comorbidities.  The bleeding index is 'intermediate risk'.  Positive CHADS2 values include History of CHF, History of HTN, and Age > 30 years old.  His last INR was 0.9 ratio.  Anticoagulation responsible provider: Myrtis Ser MD, Tinnie Gens.  INR POC: 4.3.  Cuvette Lot#: [id number].  Exp: 12/2010.    Anticoagulation Management Assessment/Plan:      The patient's current anticoagulation dose is Warfarin sodium 5 mg tabs: take as directed.  The target INR is 2.0-3.0.  The next INR is due 10/13/2009.  Anticoagulation instructions were given to patient/spouse.  Results were reviewed/authorized by Cloyde Reams, RN, BSN.  He was notified by Cloyde Reams RN.         Prior Anticoagulation Instructions: INR 4.5  Hold coumadin today.  Then change to 1 tab on Tuesday, Thursday, and Saturday and 1.5 tabs all other days.  Re-check INR in 1 week.  Current Anticoagulation Instructions: INR 4.3  Skip today's dosage of coumadin, then start taking 1 tablet daily except 1.5  tablets on Tuesdays and Saturdays.  Recheck in 10 days.

## 2010-04-13 NOTE — Letter (Signed)
Summary: Pre Visit Letter Revised  [facility]  [address] [place], [place] [address]   Phone: [phone]  Fax: [phone]        11/12/2009 MRN: [id number] [name] [address] [place], [place]  [address]             (Procedure Date:  11-27-09)  Welcome to the Gastroenterology Division at [facility].    You are scheduled to see a nurse for your pre-procedure visit on 11-20-09 at 1pm,  on the 3rd floor at [facility], [address].  We ask that you try to arrive at our office 15 minutes prior to your appointment time to allow for check-in.  Please take a minute to review the attached form.  If you answer "Yes" to one or more of the questions on the first page, we ask that you call the person listed at your earliest opportunity.  If you answer "No" to all of the questions, please complete the rest of the form and bring it to your appointment.    Your nurse visit will consist of discussing your medical and surgical history, your immediate family medical history, and your medications.   If you are unable to list all of your medications on the form, please bring the medication bottles to your appointment and we will list them.  We will need to be aware of both prescribed and over the counter drugs.  We will need to know exact dosage information as well.    Please be prepared to read and sign documents such as consent forms, a financial agreement, and acknowledgement forms.  If necessary, and with your consent, a friend or relative is welcome to sit-in on the nurse visit with you.  Please bring your insurance card so that we may make a copy of it.  If your insurance requires a referral to see a specialist, please bring your referral form from your primary care physician.  No co-pay is required for this nurse visit.     If you cannot keep your appointment, please call [phone] to cancel or reschedule prior to your appointment date.  This allows [place] the  opportunity to schedule an appointment for another patient in need of care.    Thank you for choosing  Gastroenterology for your medical needs.  We appreciate the opportunity to care for you.  Please visit [place] at our website  to learn more about our practice.  Sincerely, The Gastroenterology Division          Appended Document: Pre Visit Letter Revised Letter mailed to patient.

## 2010-04-13 NOTE — Medication Information (Signed)
Summary: [name]  Anticoagulant Therapy  Managed by: Cloyde Reams, RN, BSN Referring MD: Shirlee Latch PCP: Aida Puffer, M.D. Supervising MD: Juanda Chance MD, Bruce Indication 1: Atrial Fibrillation Lab Used: [facility] [place] Site: [address] INR POC 3.5 INR RANGE 2.0-3.0  Dietary changes: no    Health status changes: no    Bleeding/hemorrhagic complications: yes       Details: Oozing of blood from dialysis graft.   Recent/future hospitalizations: no    Any changes in medication regimen? no    Recent/future dental: no  Any missed doses?: no       Is patient compliant with meds? yes       Allergies: No Known Drug Allergies  Anticoagulation Management History:      Positive risk factors for bleeding include an age of 75 years or older and presence of serious comorbidities.  The bleeding index is 'intermediate risk'.  Positive CHADS2 values include History of CHF, History of HTN, and Age > 64 years old.  His last INR was 0.9 ratio.  Anticoagulation responsible provider: Juanda Chance MD, Smitty Cords.  INR POC: 3.5.  Exp: 12/2010.    Anticoagulation Management Assessment/Plan:      The patient's current anticoagulation dose is Warfarin sodium 5 mg tabs: take as directed.  The target INR is 2.0-3.0.  The next INR is due 10/19/2009.  Anticoagulation instructions were given to patient/spouse.  Results were reviewed/authorized by Cloyde Reams, RN, BSN.  He was notified by Cloyde Reams RN.         Prior Anticoagulation Instructions: INR 4.3  Skip today's dosage of coumadin, then start taking 1 tablet daily except 1.5 tablets on Tuesdays and Saturdays.  Recheck in 10 days.    Current Anticoagulation Instructions: INR 3.5  Skip today's dosage, then start taking 5mg  daily.  Recheck in 10 days.

## 2010-04-13 NOTE — Progress Notes (Signed)
Summary: Endo/Colon Scheduled  Phone Note Outgoing Call   Summary of Call: Please see if I would be able to do his colonoscopy on Fri Sept 16 in AM? (I am off that day but can do some AM cases if [facility] can let me)  Is ok to hold Plavix, is in the chart, and he is a T-TH-Sat dialysis patient. He has prep. and just needs to know when to hold Plavix, etc. I think.  Iva Boop MD, [facility]  November 12, 2009 9:44 AM   Follow-up for Phone Call        DR.GESSNER--Mrs.Stallbaumer thought you were going to do an Endo. as well as the Colon. She states pt. has alot of epigastric pain. She states she would like the Endo. to be done at the same time as the Colon. Please advise.  (His spot is booked for 11-27-09 at 8:45am at [facility]) Follow-up by: Laureen Ochs LPN,  November 12, 2009 11:16 AM  Additional Follow-up for Phone Call Additional follow up Details #1::        Oops ..schedule EGD/colonoscopy re 792.1 and anemia Iva Boop MD, [facility]  November 12, 2009 11:45 AM     Additional Follow-up for Phone Call Additional follow up Details #2::    Pt. is scheduled for a previsit on 11-20-09 at 1pm and an Endo/Colon at [facility] on 11-27-09 at 8:45am. Message left for patient to callback. Laureen Ochs LPN  November 12, 2009 11:54 AM   Appts reviewed w/Mrs.Budhu by phone. Pt. instructed to call back as needed.  Follow-up by: Laureen Ochs LPN,  November 12, 2009 2:37 PM

## 2010-04-13 NOTE — Medication Information (Signed)
Summary: [name]  Anticoagulant Therapy  Managed by: Bethena Midget, RN, BSN Referring MD: Shirlee Latch PCP: Aida Puffer, M.D. Supervising MD: Graciela Husbands MD, Viviann Spare Indication 1: Atrial Fibrillation Lab Used: [facility] [place] Site: [address] INR POC 2.5 INR RANGE 2.0-3.0  Dietary changes: no    Health status changes: no    Bleeding/hemorrhagic complications: yes       Details: Stool sample pos. for blood, will call when appt. for colonoscopy scheduled.        Allergies: No Known Drug Allergies  Anticoagulation Management History:      The patient is taking warfarin and comes in today for a routine follow up visit.  Positive risk factors for bleeding include an age of 75 years or older and presence of serious comorbidities.  The bleeding index is 'intermediate risk'.  Positive CHADS2 values include History of CHF, History of HTN, and Age > 4 years old.  His last INR was 0.9 ratio.  Anticoagulation responsible provider: Graciela Husbands MD, Viviann Spare.  INR POC: 2.5.  Cuvette Lot#: [id number].  Exp: 12/2010.    Anticoagulation Management Assessment/Plan:      The patient's current anticoagulation dose is Warfarin sodium 5 mg tabs: take as directed.  The target INR is 2.0-3.0.  The next INR is due 12/01/2009.  Anticoagulation instructions were given to patient/spouse.  Results were reviewed/authorized by Bethena Midget, RN, BSN.  He was notified by Liana Gerold, PharmD Candidate.         Prior Anticoagulation Instructions: INR 2.3  Continue taking 1 tablet (5mg ) every day.    Current Anticoagulation Instructions: INR 2.5  Continue 1 tablet daily.  Return to clinic in 4 weeks.  Order sent to HD. Prescriptions: PRAVASTATIN SODIUM 40 MG TABS (PRAVASTATIN SODIUM) one in the evening  #90 x 3   Entered by:   Katina Dung, RN, BSN   Authorized by:   Marca Ancona, MD   Signed by:   Katina Dung, RN, BSN on 11/05/2009   Method used:   Electronically to        [facility]*  (retail)             ,          Ph: [phone]       Fax: [phone]   RxID:   [id number]

## 2010-04-13 NOTE — Assessment & Plan Note (Signed)
Summary: 11:15/per check out/sf   Referring Provider:  Dr. Arrie Aran Primary Provider:  Aida Puffer, M.D.   History of Present Illness: 77 yo with history of vascular disease, ESRD, and diastolic CHF returns for evaluation of new-onset atrial fibrillation.  Patient was noted to have atrial fibrillation recently at dialysis.  Heart rate appears to have gotten up as high as the 150s.  He was in NSR at initial appointment and is in NSR today.  He did not know he was in atrial fibrillation (no palpitations, etc).  He has never noted palpitations or irregular heart rhythm in the past.    Patient has had generalized fatigue for years.  He sleeps a lot and is not very active.  He has been short of breath after walking about 50 feet or a flight of steps for years.  No chest pain.  BP is high today at 169/68.  Patient does report occasional coughing, often with meals.  This predates the use of fosinopril.   Since last appointment, ABIs were done due to heaviness in his legs and difficult to palpate pedal pulses. ABIs were normal, suggesting no significant PAD.  Echo showed moderate LVH with EF 50-55% and mild AS.  Carotid dopplers showed 40-59% RICA stenosis.   Labs (6/11): HCT 34.2, creatinine 5.3, LDL 75, HDL 33, LFTs normal  Current Medications (verified): 1)  Omeprazole 20 Mg Tbec (Omeprazole) .... Take 1 Tablet By Mouth Daily 2)  Nephro-Vite Rx 1 Mg Tabs (B Complex-C-Folic Acid) .... Take 1 Tablet By Mouth Once A Day 3)  Hydrocodone-Acetaminophen 7.5-500 Mg/81ml Soln (Hydrocodone-Acetaminophen) .... As Needed Pain 4)  Paroxetine Hcl 20 Mg Tabs (Paroxetine Hcl) .... Take One Tablet Once Daily 5)  Fosinopril Sodium 20 Mg Tabs (Fosinopril Sodium) .... One Daily 6)  Stool Softener 100 Mg Caps (Docusate Sodium) .... Take 2 Tablets Once Daily 7)  Complete Allergy 25 Mg Caps (Diphenhydramine Hcl) .... As Needed For Allergies 8)  Toprol Xl 25 Mg Xr24h-Tab (Metoprolol Succinate) .... Take One Tablet Two  Times A Day 9)  Aspirin 81 Mg Tabs (Aspirin) .... Take One Tablet Once Daily 10)  Warfarin Sodium 5 Mg Tabs (Warfarin Sodium) .... Take As Directed 11)  Pravastatin Sodium 40 Mg Tabs (Pravastatin Sodium) .... One in The Evening  Allergies (verified): No Known Drug Allergies  Past History:  Past Medical History: 1. End-stage renal disease (PD now HD T, R, Sa).  Had MRSE peritonitis with PD, necessitating change to HD.  2. Depression 3. Hypertension 4. Nephrolithiasis 5. Esophageal dysmotility 6. GERD, small hiatal hernia, h/o peptic stricture.  7. Renal vascular disease: Occluded renal arteries bilaterally on cath 3/10.  8. CAD: Nonobstructive.  LHC (3/10) with 30% ostial RCA, 30% ostial LAD, 40% ostial D1.  EF 50%.  9. Diastolic CHF: Chronic exertional dyspnea.  Echo (7/11): EF 50-55%, moderate LV hypertrophy, no regional WMAs, mild AI, mild AS, mild MR.  10. Paroxysmal atrial fibrillation: Noted first in 6/11.  11. Mild aortic stenosis 12. Carotid stenosis: 40-59% RICA by dopplers (7/11) 13. ABIs (7/11): normal  Family History: Reviewed history from 09/10/2009 and no changes required. CVA- mother and father HTN, DM  Social History: Reviewed history from 09/10/2009 and no changes required. Married, 1 boy, 2 girls. Lives in [place].  Patient has never smoked.  Alcohol Use - no  Review of Systems       All systems reviewed and negative except as per HPI.   Vital Signs:  Patient profile:   78  year old male Height:      68 inches Weight:      191 pounds BMI:     29.15 Pulse rate:   63 / minute Pulse rhythm:   regular BP sitting:   169 / 68  (right arm) Cuff size:   regular  Vitals Entered By: Judithe Modest CMA (October 19, 2009 11:25 AM)   Physical Exam  General:  Well developed, well nourished, in no acute distress. Obese.  Neck:  Neck supple, no JVD. No masses, thyromegaly or abnormal cervical nodes. Lungs:  Clear bilaterally to auscultation and  percussion. Heart:  Non-displaced PMI, chest non-tender; regular rate and rhythm, S1, S2 without rubs or gallops. 2/6 early systolic ejection-type murmur.  Bilateral carotid bruits.  Unable to palpate pedal pulses. No edema, no varicosities. Abdomen:  Bowel sounds positive; abdomen soft and non-tender without masses, organomegaly, or hernias noted. No hepatosplenomegaly. Extremities:  No clubbing or cyanosis. Neurologic:  Alert and oriented x 3. Psych:  Normal affect.   Impression & Recommendations:  Problem # 1:  CAD, NATIVE VESSEL (ICD-414.01) Patient has known vascular disease with nonobstructive CAD, carotid stenosis, and renal artery stenosis.  He will continue ASA 81 mg daily.  He has been started on a low dose of pravastatin despite low LDL given his vascular disease.  Needs lipids/LFTs in 9/11.   Problem # 2:  DIASTOLIC HEART FAILURE, CHRONIC (ICD-428.32) Patient does not seem particularly volume overloaded today.  His volume is controlled by dialysis.  He has been chronically short of breath for years.  EF 50-55% on recent echo with moderate LV hypertrophy.    Problem # 3:  CAROTID ARTERY STENOSIS (ICD-433.10) 40-59% RICA stenosis.  Followup carotid dopplers in 7/12.   Problem # 4:  HYPERTENSION, UNSPECIFIED (ICD-401.9) Increase fosinopril to 20 mg daily.   Problem # 5:  COUGH Patient has a cough that tends to occur while eating meals.  This predated starting fosinopril.  I question the possibility of aspiration.  I suggested that he eat slowly.  May want to consider swallow study (will leave up to PCP).   Other Orders: Carotid Duplex (Carotid Duplex)  Patient Instructions: 1)  Your physician has recommended you make the following change in your medication:  2)  Increase Fosinopril  to 20mg  daily. 3)  Your physician recommends that you return for a FASTING lipid profile/liver profile in September.  427.31 4)  Your physician recommends that you schedule a follow-up appointment  in: 4 months with Dr Shirlee Latch. 5)  Your physician has requested that you have a carotid duplex. This test is an ultrasound of the carotid arteries in your neck. It looks at blood flow through these arteries that supply the brain with blood. Allow one hour for this exam. There are no restrictions or special instructions. JULY 2012 Prescriptions: FOSINOPRIL SODIUM 20 MG TABS (FOSINOPRIL SODIUM) one daily  #90 x 3   Entered by:   Katina Dung, RN, BSN   Authorized by:   Marca Ancona, MD   Signed by:   Katina Dung, RN, BSN on 10/19/2009   Method used:   Print then Give to Patient   RxID:   [id number] FOSINOPRIL SODIUM 20 MG TABS (FOSINOPRIL SODIUM) one daily  #30 x 6   Entered by:   Katina Dung, RN, BSN   Authorized by:   Marca Ancona, MD   Signed by:   Katina Dung, RN, BSN on 10/19/2009   Method used:   Electronically to  [facility]* (retail)       [address] [place], [place]  [address]       Ph: [phone] or [phone]       Fax: [phone]   RxID:   [id number]   Appended Document: 11:15/per check out/sf Problem # 6: ATRIAL FIBRILLATION Stable, no palpitations and in NSR today.  Continue Toprol XL and coumadin.

## 2010-04-13 NOTE — Progress Notes (Signed)
Summary: status of paperwork that was faxed   Phone Note From Other Clinic   CallerLawson Fiscal [facility] [phone]  # [phone] Request: Talk with Nurse Details for Reason: status of paper work  that was on faxed on 7/2. for dna testing for warfarin Initial call taken by: Lorne Skeens,  September 22, 2009 9:16 AM  Follow-up for Phone Call        talked with Janette at [facility] if fax had been received to authorize DNA testing for [facility] not received paperwork   Janette aware

## 2010-04-13 NOTE — Progress Notes (Signed)
Summary: Triage-anemia,rectal bleeding  Phone Note From Other Clinic Call back at [phone]  [phone]   Caller: Nicole Cella, scheduler Call For: Dr. Leone Payor Reason for Call: Schedule Patient Appt Summary of Call: Dr. Arrie Aran would like pt seen asap for low hemoglobin, rectal bleeding, on Coumadin Initial call taken by: Vallarie Mare,  November 05, 2009 2:53 PM  Follow-up for Phone Call        Pt. will see Dr.Kisha Messman on 11-06-09 at 3pm. Nicole Cella will advise pt. of appt/med.list/co-pay/cx.policy and she will fax records to [facility].  Follow-up by: Laureen Ochs LPN,  November 05, 2009 3:24 PM

## 2010-04-13 NOTE — Letter (Signed)
Summary: [facility] Problem List  [facility] Problem List   Imported By: Marylou Mccoy 12/24/2009 14:01:41  _____________________________________________________________________  External Attachment:    Type:   Image     Comment:   External Document

## 2010-04-13 NOTE — Medication Information (Signed)
Summary: Physician's Orders   Physician's Orders   Imported By: Roderic Ovens 12/23/2009 11:41:07  _____________________________________________________________________  External Attachment:    Type:   Image     Comment:   External Document

## 2010-04-13 NOTE — Letter (Signed)
Summary: Date Range 10-24-09 to 11-03-09/[facility]  Date Range 10-24-09 to 11-03-09/[facility]   Imported By: Sherian Rein 11/11/2009 15:08:24  _____________________________________________________________________  External Attachment:    Type:   Image     Comment:   External Document

## 2010-04-13 NOTE — Medication Information (Signed)
Summary: rov/ewj  Anticoagulant Therapy  Managed by: Weston Brass, PharmD Referring MD: Shirlee Latch PCP: Aida Puffer, M.D. Supervising MD: Clifton James MD, Cristal Deer Indication 1: Atrial Fibrillation Lab Used: [facility] [place] Site: [address] INR POC 2.3 INR RANGE 2.0-3.0  Dietary changes: no    Health status changes: no    Bleeding/hemorrhagic complications: no    Recent/future hospitalizations: no    Any changes in medication regimen? no    Recent/future dental: no  Any missed doses?: no       Is patient compliant with meds? yes       Allergies: No Known Drug Allergies  Anticoagulation Management History:      The patient is taking warfarin and comes in today for a routine follow up visit.  Positive risk factors for bleeding include an age of 75 years or older and presence of serious comorbidities.  The bleeding index is 'intermediate risk'.  Positive CHADS2 values include History of CHF, History of HTN, and Age > 55 years old.  His last INR was 0.9 ratio.  Anticoagulation responsible provider: Clifton James MD, Cristal Deer.  INR POC: 2.3.  Cuvette Lot#: [id number].  Exp: 12/2010.    Anticoagulation Management Assessment/Plan:      The patient's current anticoagulation dose is Warfarin sodium 5 mg tabs: take as directed.  The target INR is 2.0-3.0.  The next INR is due 11/05/2009.  Anticoagulation instructions were given to patient/spouse.  Results were reviewed/authorized by Weston Brass, PharmD.  He was notified by Gweneth Fritter, PharmD Candidate.         Prior Anticoagulation Instructions: INR 3.5  Skip today's dosage, then start taking 5mg  daily.  Recheck in 10 days.    Current Anticoagulation Instructions: INR 2.3  Continue taking 1 tablet (5mg ) every day.

## 2010-04-13 NOTE — Medication Information (Signed)
Summary: rov/sp  Anticoagulant Therapy  Managed by: Weston Brass, PharmD Referring MD: Shirlee Latch PCP: Aida Puffer, M.D. Supervising MD: Tenny Craw MD, Gunnar Fusi Indication 1: Atrial Fibrillation Lab Used: [facility] [place] Site: [address] INR POC 1.0 INR RANGE 2.0-3.0  Dietary changes: no    Health status changes: no    Bleeding/hemorrhagic complications: no    Recent/future hospitalizations: no    Any changes in medication regimen? no    Recent/future dental: no  Any missed doses?: no       Is patient compliant with meds? yes       Allergies: No Known Drug Allergies  Anticoagulation Management History:      The patient is taking warfarin and comes in today for a routine follow up visit.  Positive risk factors for bleeding include an age of 75 years or older and presence of serious comorbidities.  The bleeding index is 'intermediate risk'.  Positive CHADS2 values include History of CHF, History of HTN, and Age > 75 years old.  His last INR was 0.9 ratio.  Anticoagulation responsible provider: Tenny Craw MD, Gunnar Fusi.  INR POC: 1.0.  Cuvette Lot#: [id number].  Exp: 11/2010.    Anticoagulation Management Assessment/Plan:      The patient's current anticoagulation dose is Warfarin sodium 5 mg tabs: one daily or as directed.  The target INR is 2.0-3.0.  The next INR is due 09/24/2009.  Anticoagulation instructions were given to patient/spouse.  Results were reviewed/authorized by Weston Brass, PharmD.  He was notified by Weston Brass PharmD.         Current Anticoagulation Instructions: INR 1.0  Increase Coumadin to 1 1/2 tablets (7.5mg ) daily.

## 2010-04-13 NOTE — Procedures (Signed)
Summary: Colonoscopy  Patient: [name] Note: All result statuses are Final unless otherwise noted.  Tests: (1) Colonoscopy (COL)   COL Colonoscopy           DONE     [facility]     [address] [place], [place]  [address]           COLONOSCOPY PROCEDURE REPORT           PATIENT:  Casey [name], Casey [name]  MR#:  [id number]     BIRTHDATE:  [birth year], 78 yrs. old  GENDER:  male     ENDOSCOPIST:  Iva Boop, MD, [facility]           PROCEDURE DATE:  11/27/2009     PROCEDURE:  Colonoscopy 36[address]     ASA CLASS:  Class III     INDICATIONS:  heme positive stool, Anemia     MEDICATIONS:   Fentanyl 25 mcg, Versed 2 mg, There was residual     sedation effect present from prior procedure.           DESCRIPTION OF PROCEDURE:   After the risks benefits and     alternatives of the procedure were thoroughly explained, informed     consent was obtained.  Digital rectal exam was performed and     revealed no abnormalities.   The  endoscope was introduced through     the anus and advanced to the cecum, which was identified by both     the appendix and ileocecal valve, without limitations.  The     quality of the prep was excellent, using MoviPrep.  The instrument     was then slowly withdrawn as the colon was fully examined.     Insertion: 4 minutes Withdrawal: 7 minutes     <<PROCEDUREIMAGES>>           FINDINGS:  A normal appearing cecum, ileocecal valve, and     appendiceal orifice were identified. The ascending, hepatic     flexure, transverse, splenic flexure, descending, sigmoid colon,     and rectum appeared unremarkable.   Retroflexed views in the     rectum revealed internal hemorrhoids.    The scope was then     withdrawn from the patient and the procedure completed.           COMPLICATIONS:  None     ENDOSCOPIC IMPRESSION:     1) Normal colon     2) Internal hemorrhoids     RECOMMENDATIONS:     await EGD biospies     Not sure of utility of further evaluation     I think Heme + stools could be from hemorrhoids (on warfarin)     and with an elevated ferritin doubt a chronic blood loss anemia                 Resume warfarin and have INR checked wher he usually does by     next week.     REPEAT EXAM:  No           Iva Boop, MD, Clementeen Graham           CC:  Terrial Rhodes, MD     The Patient           n.     eSIGNED:   Iva Boop at 11/27/2009 09:47 AM           Harl Bowie, [id number]  Note:  An exclamation mark (!) indicates a result that was not dispersed into the flowsheet. Document Creation Date: 11/27/2009 9:48 AM _______________________________________________________________________  (1) Order result status: Final Collection or observation date-time: 11/27/2009 09:38 Requested date-time:  Receipt date-time:  Reported date-time:  Referring Physician:   Ordering Physician: Stan Head [phone]) Specimen Source:  Source: Launa Grill Order Number: [id number] Lab site:

## 2010-04-13 NOTE — Procedures (Signed)
Summary: Instructions for procedure/[place]  Instructions for procedure/[place]   Imported By: Sherian Rein 11/24/2009 09:23:56  _____________________________________________________________________  External Attachment:    Type:   Image     Comment:   External Document

## 2010-04-13 NOTE — Medication Information (Signed)
Summary: Physician's Order  Physician's Order   Imported By: Roderic Ovens 11/09/2009 16:12:33  _____________________________________________________________________  External Attachment:    Type:   Image     Comment:   External Document

## 2010-04-13 NOTE — Miscellaneous (Signed)
Summary: [facility]  Clinical Lists Changes  Medications: Added new medication of MOVIPREP 100 GM  SOLR (PEG-KCL-NACL-NASULF-NA ASC-C) As per prep instructions. - Signed Rx of MOVIPREP 100 GM  SOLR (PEG-KCL-NACL-NASULF-NA ASC-C) As per prep instructions.;  #1 x 0;  Signed;  Entered by: Durwin Glaze RN;  Authorized by: Iva Boop MD, [facility];  Method used: Electronically to [facility]*, [address], [place], [place]  [address], Ph: [phone] or [phone], Fax: [phone] Observations: Added new observation of NKA: T (11/20/2009 12:32)    Prescriptions: MOVIPREP 100 GM  SOLR (PEG-KCL-NACL-NASULF-NA ASC-C) As per prep instructions.  #1 x 0   Entered by:   Durwin Glaze RN   Authorized by:   Iva Boop MD, [facility]   Signed by:   Durwin Glaze RN on 11/20/2009   Method used:   Electronically to        [facility]* (retail)       [address] [place], [place]  [address]       Ph: [phone] or [phone]       Fax: [phone]   RxID:   [id number]

## 2010-04-13 NOTE — Medication Information (Signed)
Summary: rov/sp  Anticoagulant Therapy  Managed by: Weston Brass, PharmD Referring MD: Shirlee Latch PCP: Aida Puffer, M.D. Supervising MD: Riley Kill MD, Maisie Fus Indication 1: Atrial Fibrillation Lab Used: [facility] [place] Site: [address] INR POC 4.5 INR RANGE 2.0-3.0  Dietary changes: no    Health status changes: no    Bleeding/hemorrhagic complications: no    Recent/future hospitalizations: no    Any changes in medication regimen? no    Recent/future dental: no  Any missed doses?: no       Is patient compliant with meds? yes       Current Medications (verified): 1)  Omeprazole 20 Mg Tbec (Omeprazole) .... Take 1 Tablet By Mouth Two Times A Day 2)  Nephro-Vite Rx 1 Mg Tabs (B Complex-C-Folic Acid) .... Take 1 Tablet By Mouth Once A Day 3)  Hydrocodone-Acetaminophen 7.5-500 Mg/66ml Soln (Hydrocodone-Acetaminophen) .... As Needed Pain 4)  Paroxetine Hcl 20 Mg Tabs (Paroxetine Hcl) .... Take One Tablet Once Daily 5)  Fosinopril Sodium 10 Mg Tabs (Fosinopril Sodium) .... Take One Tablet Once Daily 6)  Stool Softener 100 Mg Caps (Docusate Sodium) .... Take 2 Tablets Once Daily 7)  Complete Allergy 25 Mg Caps (Diphenhydramine Hcl) .... As Needed For Allergies 8)  Toprol Xl 25 Mg Xr24h-Tab (Metoprolol Succinate) .... Take One Tablet Two Times A Day 9)  Aspirin 81 Mg Tabs (Aspirin) .... Take One Tablet Once Daily 10)  Warfarin Sodium 5 Mg Tabs (Warfarin Sodium) .... Take As Directed  Allergies: No Known Drug Allergies  Anticoagulation Management History:      The patient is taking warfarin and comes in today for a routine follow up visit.  Positive risk factors for bleeding include an age of 75 years or older and presence of serious comorbidities.  The bleeding index is 'intermediate risk'.  Positive CHADS2 values include History of CHF, History of HTN, and Age > 77 years old.  His last INR was 0.9 ratio.  Anticoagulation responsible provider: Riley Kill MD, Maisie Fus.   INR POC: 4.5.  Cuvette Lot#: [id number].  Exp: 11/2010.    Anticoagulation Management Assessment/Plan:      The patient's current anticoagulation dose is Warfarin sodium 5 mg tabs: take as directed.  The target INR is 2.0-3.0.  The next INR is due 10/01/2009.  Anticoagulation instructions were given to patient/spouse.  Results were reviewed/authorized by Weston Brass, PharmD.  He was notified by Dillard Cannon.         Prior Anticoagulation Instructions: INR 1.0  Increase Coumadin to 1 1/2 tablets (7.5mg ) daily.    Current Anticoagulation Instructions: INR 4.5  Hold coumadin today.  Then change to 1 tab on Tuesday, Thursday, and Saturday and 1.5 tabs all other days.  Re-check INR in 1 week. Prescriptions: WARFARIN SODIUM 5 MG TABS (WARFARIN SODIUM) take as directed  #135 x 1   Entered by:   Cloyde Reams RN   Authorized by:   Marca Ancona, MD   Signed by:   Cloyde Reams RN on 09/25/2009   Method used:   Electronically to        [facility]* (retail)             ,          Ph: [phone]       Fax: [phone]   RxID:   [id number] WARFARIN SODIUM 5 MG TABS (WARFARIN SODIUM) take as directed  #45 x 1   Entered by:   Cloyde Reams RN   Authorized  by:   Marca Ancona, MD   Signed by:   Cloyde Reams RN on 09/25/2009   Method used:   Electronically to        [facility]* (retail)       [address] [place], [place]  [address]       Ph: [phone] or [phone]       Fax: [phone]   RxID:   [id number]

## 2010-04-13 NOTE — Medication Information (Signed)
Summary: Coumadin Clinic  Anticoagulant Therapy  Managed by: Cloyde Reams, RN, BSN Referring MD: Shirlee Latch PCP: Terrial Rhodes, MD Supervising MD: Daleen Squibb MD, Maisie Fus Indication 1: Atrial Fibrillation Lab Used: [facility] [place] Site: [address] PT 19.1 INR POC 1.74 INR RANGE 2.0-3.0    Bleeding/hemorrhagic complications: no     Any changes in medication regimen? no     Any missed doses?: yes     Details: Off Coumadin last week x 5 days for colonoscopy and endoscopy.   Is patient compliant with meds? yes       Allergies: No Known Drug Allergies  Anticoagulation Management History:      His anticoagulation is being managed by telephone today.  Positive risk factors for bleeding include an age of 10 years or older and presence of serious comorbidities.  The bleeding index is 'intermediate risk'.  Positive CHADS2 values include History of CHF, History of HTN, and Age > 35 years old.  His last INR was 0.9 ratio.  Prothrombin time is 19.1.  Anticoagulation responsible Tichina Koebel: Daleen Squibb MD, Maisie Fus.  INR POC: 1.74.  Exp: 12/2010.    Anticoagulation Management Assessment/Plan:      The patient's current anticoagulation dose is Warfarin sodium 5 mg tabs: take as directed.  The target INR is 2.0-3.0.  The next INR is due 12/10/2009.  Anticoagulation instructions were given to patient/spouse.  Results were reviewed/authorized by Cloyde Reams, RN, BSN.  He was notified by Cloyde Reams RN.         Prior Anticoagulation Instructions: INR 2.5  Continue 1 tablet daily.  Return to clinic in 4 weeks.  Order sent to HD.  Current Anticoagulation Instructions: INR 1.74  Called spoke with pt's wife, pt has been off coumadin x 5 days for colonoscopy. Continue on same dosage 5mg  daily.  Recheck on 12/10/09 if good return to q4wk f/u.  Orders faxed to HD. Cloyde Reams RN  December 04, 2009 11:42 AM

## 2010-04-13 NOTE — Assessment & Plan Note (Signed)
Summary: ANEMIA, RECTAL BLEEDING            DEBORAH   History of Present Illness Visit Type: Follow-up Consult Primary GI MD: Stan Head MD [facility] Primary Maykel Reitter: Terrial Rhodes, MD Requesting Naudia Crosley: Terrial Rhodes, MD Chief Complaint: Anemia, hemo positive stool. History of Present Illness:   Patient referred for anemia and hemo positive stool on hemocults. He denies any GI complaints states he has not seen any blood in his stool.  He has been followed at hemodialysis cvneter with decling Hgb. EPO and iron Tx given but still with declinibg Hgb. Was started on warfarin for paroxysmal Afib this year. 3/3 stool guaiacs +   GI Review of Systems      Denies abdominal pain, acid reflux, belching, bloating, chest pain, dysphagia with liquids, dysphagia with solids, heartburn, loss of appetite, nausea, vomiting, vomiting blood, weight loss, and  weight gain.      Reports heme positive stool.     Denies anal fissure, black tarry stools, change in bowel habit, constipation, diarrhea, diverticulosis, fecal incontinence, hemorrhoids, irritable bowel syndrome, jaundice, light color stool, liver problems, rectal bleeding, and  rectal pain. Preventive Screening-Counseling & Management      Drug Use:  no.      Clinical Reports Reviewed:  Colonoscopy:  08/31/2004:  Results: Normal. Location:  [facility].  Over sedation issues during procedure.  EGD  Procedure date:  11/26/2007  Findings:      1) ESOPHAGEAL DYSMOTILITY CAUSING DYSPHAGIA, DID NOT PREVIOUSLY RESPOND TO DILATIO. TREATED WITH BOTOX INJECTION TODAY 2) GASTRIC RETENTION, MILD-MODERATE 3) ANTREAL ERYTEMA AND EDEMA, FOCAL 4) OTHERWISE NORMAL   Current Medications (verified): 1)  Omeprazole 20 Mg Tbec (Omeprazole) .... Take 1 Tablet By Mouth Daily 2)  Nephro-Vite Rx 1 Mg Tabs (B Complex-C-Folic Acid) .... Take 1 Tablet By Mouth Once A Day 3)  Percocet 5-325 Mg Tabs (Oxycodone-Acetaminophen) .... Take  One By Mouth Two Times A Day 4)  Paroxetine Hcl 20 Mg Tabs (Paroxetine Hcl) .... Take One Tablet Once Daily 5)  Fosinopril Sodium 20 Mg Tabs (Fosinopril Sodium) .... One Daily 6)  Stool Softener 100 Mg Caps (Docusate Sodium) .... Take 2 Tablets Once Daily 7)  Toprol Xl 25 Mg Xr24h-Tab (Metoprolol Succinate) .... Take One Tablet Two Times A Day 8)  Aspirin 81 Mg Tabs (Aspirin) .... Take One Tablet Once Daily 9)  Warfarin Sodium 5 Mg Tabs (Warfarin Sodium) .... Take As Directed 10)  Pravastatin Sodium 40 Mg Tabs (Pravastatin Sodium) .... One in The Evening  Allergies (verified): No Known Drug Allergies  Past History:  Past Medical History: 1. End-stage renal disease (PD now HD T, R, Sa).  Had MRSE peritonitis with PD, necessitating change to HD.           secondary hyperparathyroidism 2. Depression 3. Hypertension 4. Nephrolithiasis 5. Esophageal dysmotility 6. GERD, small hiatal hernia, h/o peptic stricture.  7. Renal vascular disease: Occluded renal arteries bilaterally on cath 3/10.  8. CAD: Nonobstructive.  LHC (3/10) with 30% ostial RCA, 30% ostial LAD, 40% ostial D1.  EF 50%.  9. Diastolic CHF: Chronic exertional dyspnea.  Echo (7/11): EF 50-55%, moderate LV hypertrophy, no regional WMAs, mild AI, mild AS, mild MR.  10. Paroxysmal atrial fibrillation: Noted first in 6/11.  11. Mild aortic stenosis 12. Carotid stenosis: 40-59% RICA by dopplers (7/11) 13. ABIs (7/11): normal  Past Surgical History: Hemorrhoidectomy Tenckhoff catheter AV fistula Esophageal dilation 10/07 vasectomy  Family History: CVA- mother and father No  FH of Colon Cancer:  Social History: Married, 1 boy, 2 girls. Lives in [place].  Patient has never smoked.  Alcohol Use - no Illicit Drug Use - no Drug Use:  no  Review of Systems       chronically tires, depressed, unhappy with overall quality of life All systems reviewed and negative except as per HPI.    Vital Signs:  Patient profile:   77  year old male Height:      68 inches Weight:      187.2 pounds BMI:     28.57 Pulse rate:   66 / minute Pulse rhythm:   regular BP sitting:   130 / 64  (left arm) Cuff size:   regular  Vitals Entered By: Harlow Mares CMA Duncan Dull) (November 06, 2009 2:54 PM)  Physical Exam  General:  chronically ill NAD Eyes:  PERRLA, no icterus. Mouth:  No deformity or lesions, dentition normal. Lungs:  Clear throughout to auscultation. Heart:  Regular rate and rhythm; no murmurs, rubs,  or bruits. Abdomen:  Bowel sounds positive; abdomen soft and non-tender without masses, organomegaly, or hernias noted. No hepatosplenomegaly. Rectal:  deferred until time of colonoscopy.   Extremities:  No clubbing, cyanosis, edema or deformities noted. Neurologic:  Alert and  oriented x3 Cervical Nodes:  No significant cervical or supraclavicular adenopathy.  Psych:  flat affect   Impression & Recommendations:  Problem # 1:  BLOOD IN STOOL, OCCULT (ICD-792.1) Assessment New Etiology not clear he is on warfarin and at risk for worsening the anemai prudent to look for a treatable cause, as may have blood loss anemia on top of chronic disease anemia  Problem # 2:  ANEMIA (ICD-285.9) Assessment: New Most likely all chronic disease but could be blood loss also Ferritin is elevated  Problem # 3:  COUMADIN THERAPY (ICD-V58.61) Assessment: Comment Only For Afib Should optimally be held prior to endoscopic procedures which raises risk some as chance of stroke. I explained this to patient and wife. Will ask Dr. Shirlee Latch re: holding warfarin 3-5 days reasonable.  Problem # 4:  ATRIAL FIBRILLATION (ICD-427.31) Assessment: Unchanged  Patient Instructions: 1)  We will contact Dr. Shirlee Latch regarding your coumadin.  You will be contaced by our office prior to your procedure for directions on holding your Coumadin/Warfarin.  If you do not hear from our office 1 week prior to your scheduled procedure, please call  [phone] to discuss.  2)  We will contact you to schedule your procedures. 3)  Copy sent to : Marca Ancona, MD, Terrial Rhodes, MD 4)  The medication list was reviewed and reconciled.  All changed / newly prescribed medications were explained.  A complete medication list was provided to the patient / caregiver.  Appended Document: ANEMIA, RECTAL BLEEDING            DEBORAH No prior history of stroke so ok to hold warfarin prior to endoscopy.

## 2010-04-15 ENCOUNTER — Encounter: Payer: Self-pay | Admitting: Cardiology

## 2010-04-15 LAB — CONVERTED CEMR LAB: Prothrombin Time: 27.8 s

## 2010-04-15 NOTE — Medication Information (Signed)
Summary: Coumadin Clinic  Anticoagulant Therapy  Managed by: Bethena Midget, RN, BSN Referring MD: Shirlee Latch PCP: Terrial Rhodes, MD Supervising MD: Clifton James MD, Cristal Deer Indication 1: Atrial Fibrillation Lab Used: [facility] [place] Site: [address] PT 35.2 INR POC 3.84 INR RANGE 2.0-3.0  Dietary changes: no    Health status changes: no    Bleeding/hemorrhagic complications: no    Recent/future hospitalizations: no    Any changes in medication regimen? yes       Details: New BP med started last week. ( Amlodipine)  Recent/future dental: no  Any missed doses?: no       Is patient compliant with meds? yes       Allergies: No Known Drug Allergies  Anticoagulation Management History:      His anticoagulation is being managed by telephone today.  Positive risk factors for bleeding include an age of 75 years or older and presence of serious comorbidities.  The bleeding index is 'intermediate risk'.  Positive CHADS2 values include History of CHF, History of HTN, and Age > 59 years old.  His last INR was 0.9 ratio.  Prothrombin time is 35.2.  Anticoagulation responsible provider: Clifton James MD, Cristal Deer.  INR POC: 3.84.    Anticoagulation Management Assessment/Plan:      The patient's current anticoagulation dose is Warfarin sodium 5 mg tabs: take as directed.  The target INR is 2.0-3.0.  The next INR is due 03/30/2010.  Anticoagulation instructions were given to spouse.  Results were reviewed/authorized by Bethena Midget, RN, BSN.  He was notified by Bethena Midget, RN, BSN.         Prior Anticoagulation Instructions: INR 3.09 Today only take 2.5mg s then resume 5mg  daily. Recheck in 3 weeks. Orders sent to HD.   Current Anticoagulation Instructions: INR 3.84  Received result 03/16/10.  Attempted to call pt, LMOM to hold x 1 dosage and call back tomorrow for further dosing recommendations.  Cloyde Reams RN  March 16, 2010 4:57 PM  Spoke with wife, pt did skip last night's  dose. Change dose to 5mg s daily except 2.5mg s on Wednesdays. Recheck in 2 weeks. Orders sent to HD.

## 2010-04-15 NOTE — Assessment & Plan Note (Signed)
Summary: per check out/sf   Visit Type:  Follow-up Referring Provider:  Terrial Rhodes, MD  CC:  no complaints.  History of Present Illness: 77 yo with history of vascular disease, ESRD, paroxysmal atrial fibrillation, and diastolic CHF returns for followup.  He has been stable since I last saw him.  He has very rare tachypalpitations and thinks he could have been in atrial fibrillation briefly once or twice.  He is in sinus rhythm today.  He has chronic dyspnea after walking about 50 feet, no recent change.  He is not very active.  No chest pain.  BP has been running high: up today and has been high when he takes it at home.  He was started on amlodipine 5 mg daily in 12/11.    Labs (6/11): HCT 34.2, creatinine 5.3, LDL 75, HDL 33, LFTs normal Labs (1/61): LDL 42, HDL 22, LFTs normal  ECG: NSR, normal  Current Medications (verified): 1)  Omeprazole 40 Mg  Cpdr (Omeprazole) .Marland Kitchen.. 1 Each Day 30 Minutes Before Meal 2)  Nephro-Vite Rx 1 Mg Tabs (B Complex-C-Folic Acid) .... Take 1 Tablet By Mouth Once A Day 3)  Percocet 5-325 Mg Tabs (Oxycodone-Acetaminophen) .... Take One By Mouth Two Times A Day 4)  Paroxetine Hcl 20 Mg Tabs (Paroxetine Hcl) .... Take One Tablet Once Daily 5)  Fosinopril Sodium 20 Mg Tabs (Fosinopril Sodium) .... One Daily 6)  Stool Softener 100 Mg Caps (Docusate Sodium) .... Take 2 Tablets Once Daily 7)  Toprol Xl 25 Mg Xr24h-Tab (Metoprolol Succinate) .... Take One Tablet Two Times A Day 8)  Aspirin 81 Mg Tabs (Aspirin) .... Take One Tablet Once Daily 9)  Warfarin Sodium 5 Mg Tabs (Warfarin Sodium) .... Take As Directed 10)  Amlodipine Besylate 5 Mg Tabs (Amlodipine Besylate) .... Take One Tablet By Mouth Daily At Bedtime  Allergies (verified): No Known Drug Allergies  Past History:  Past Medical History: 1. End-stage renal disease (PD now HD T, R, Sa).  Had MRSE peritonitis with PD, necessitating change to HD. Secondary hyperparathyroidism. 2. Depression 3.  Hypertension 4. Nephrolithiasis 5. Esophageal dysmotility 6. GERD, small hiatal hernia, h/o peptic stricture, duodenitis on 9/11 EGD.  7. Renal vascular disease: Occluded renal arteries bilaterally on cath 3/10.  8. CAD: Nonobstructive.  LHC (3/10) with 30% ostial RCA, 30% ostial LAD, 40% ostial D1.  EF 50%.  9. Diastolic CHF: Chronic exertional dyspnea.  Echo (7/11): EF 50-55%, moderate LV hypertrophy, no regional WMAs, mild AI, mild AS, mild MR.  10. Paroxysmal atrial fibrillation: Noted first in 6/11.  11. Mild aortic stenosis 12. Carotid stenosis: 40-59% RICA by dopplers (7/11) 13. ABIs (7/11): normal  Family History: Reviewed history from 11/06/2009 and no changes required. CVA- mother and father No FH of Colon Cancer:  Social History: Reviewed history from 11/06/2009 and no changes required. Married, 1 boy, 2 girls. Lives in [place].  Patient has never smoked.  Alcohol Use - no Illicit Drug Use - no  Review of Systems       All systems reviewed and negative except as per HPI.   Vital Signs:  Patient profile:   77 year old male Height:      68 inches Weight:      185.75 pounds BMI:     28.35 Pulse rate:   56 / minute BP sitting:   146 / 68  (left arm) Cuff size:   regular  Vitals Entered By: Caralee Ates CMA (March 29, 2010 11:43 AM)  Physical  Exam  General:  Elderly male in no distress.  Neck:  Neck supple, no JVD. No masses, thyromegaly or abnormal cervical nodes. Lungs:  Clear bilaterally to auscultation and percussion. Heart:  Non-displaced PMI, chest non-tender; regular rate and rhythm, S1, S2 without rubs or gallops. 2/6 early systolic ejection-type murmur.  Bilateral carotid bruits.  Unable to palpate pedal pulses. No edema, no varicosities. Abdomen:  Bowel sounds positive; abdomen soft and non-tender without masses, organomegaly, or hernias noted. No hepatosplenomegaly. Extremities:  No clubbing or cyanosis. Neurologic:  Alert and oriented x 3. Psych:   Normal affect.   Impression & Recommendations:  Problem # 1:  ATRIAL FIBRILLATION (ICD-427.31) Paroxysmal atrial fibrillation.  No long symptomatic episodes.  Continue coumadin and Toprol XL.   Problem # 2:  HYPERTENSION, UNSPECIFIED (ICD-401.9) BP still high, will increase amlodipine to 10 mg daily.   Problem # 3:  DIASTOLIC HEART FAILURE, CHRONIC (ICD-428.32) Patient does not seem particularly volume overloaded today.  His volume is controlled by dialysis.  He has been chronically short of breath for years.  EF 50-55% on recent echo with moderate LV hypertrophy.    Problem # 4:  CAROTID ARTERY STENOSIS (ICD-433.10) Repeat carotid dopplers in 7/12.  Problem # 5:  HYPERLIPIDEMIA-MIXED (ICD-272.4) LDL is at goal on when last checked (< 70 with vascular disease).   Other Orders: Carotid Duplex (Carotid Duplex)  Patient Instructions: 1)  Your physician has recommended you make the following change in your medication:  2)  Increase Norvasc to 10mg  daily. 3)  Your physician has requested that you have a carotid duplex. This test is an ultrasound of the carotid arteries in your neck. It looks at blood flow through these arteries that supply the brain with blood. Allow one hour for this exam. There are no restrictions or special instructions. JULY 2012 4)  Your physician wants you to follow-up in: 6 months with Dr Shirlee Latch. Gwenlyn Perking 2012)  You will receive a reminder letter in the mail two months in advance. If you don't receive a letter, please call our office to schedule the follow-up appointment. Prescriptions: NORVASC 10 MG TABS (AMLODIPINE BESYLATE) one daily  #90 x 3   Entered by:   Katina Dung, RN, BSN   Authorized by:   Marca Ancona, MD   Signed by:   Katina Dung, RN, BSN on 03/29/2010   Method used:   [facility] to        [facility] [phone]* (retail)       [address]       [place], [place]  [id number]       Ph: [phone] or  [phone]       Fax: [phone]   RxID:   [id number]

## 2010-04-15 NOTE — Medication Information (Signed)
Summary: Physician's Orders   Physician's Orders   Imported By: Roderic Ovens 02/16/2010 12:05:19  _____________________________________________________________________  External Attachment:    Type:   Image     Comment:   External Document

## 2010-04-15 NOTE — Medication Information (Signed)
Summary: [facility]  Anticoagulant Therapy  Managed by: Bethena Midget, RN, BSN Referring MD: Shirlee Latch PCP: Terrial Rhodes, MD Supervising MD: Riley Kill MD, Maisie Fus Indication 1: Atrial Fibrillation Lab Used: HD [facility] Site: [address] PT 31.0 INR POC 3.26 INR RANGE 2.0-3.0  Dietary changes: no    Health status changes: no    Bleeding/hemorrhagic complications: no    Recent/future hospitalizations: no    Any changes in medication regimen? yes       Details: Norvasc added  Recent/future dental: no  Any missed doses?: no       Is patient compliant with meds? yes      Comments: lab from 03/30/10 - HD   Allergies: No Known Drug Allergies  Anticoagulation Management History:      His anticoagulation is being managed by telephone today.  Positive risk factors for bleeding include an age of 75 years or older and presence of serious comorbidities.  The bleeding index is 'intermediate risk'.  Positive CHADS2 values include History of CHF, History of HTN, and Age > 63 years old.  His last INR was 0.9 ratio.  Prothrombin time is 31.0.  Anticoagulation responsible provider: Riley Kill MD, Maisie Fus.  INR POC: 3.26.    Anticoagulation Management Assessment/Plan:      The patient's current anticoagulation dose is Warfarin sodium 5 mg tabs: take as directed.  The target INR is 2.0-3.0.  The next INR is due 04/13/2010.  Anticoagulation instructions were given to spouse.  Results were reviewed/authorized by Bethena Midget, RN, BSN.  He was notified by Bethena Midget, RN, BSN.         Prior Anticoagulation Instructions: INR 3.84  Received result 03/16/10.  Attempted to call pt, LMOM to hold x 1 dosage and call back tomorrow for further dosing recommendations.  Cloyde Reams RN  March 16, 2010 4:57 PM  Spoke with wife, pt did skip last night's dose. Change dose to 5mg s daily except 2.5mg s on Wednesdays. Recheck in 2 weeks. Orders sent to HD.   Current Anticoagulation Instructions: INR  3.26 LMIOM for pt to call for dosing. Bethena Midget, RN, BSN  April 01, 2010 10:59 AM Today take 2.5mg s then change dose to 5mg s daily except 2.5mg s on Wed and Sat. Recheck in 2 weeks. Orders sent to HD.

## 2010-04-21 NOTE — Medication Information (Signed)
Summary: [facility]  Anticoagulant Therapy  Managed by: Bethena Midget, RN, BSN Referring MD: Shirlee Latch PCP: Terrial Rhodes, MD Supervising MD: Shirlee Latch MD, Anahlia Iseminger Indication 1: Atrial Fibrillation Lab Used: HD [facility] [place] Site: [address] PT 27.8 INR POC 2.83 INR RANGE 2.0-3.0  Dietary changes: no    Health status changes: no    Bleeding/hemorrhagic complications: no    Recent/future hospitalizations: no    Any changes in medication regimen? no    Recent/future dental: no  Any missed doses?: no       Is patient compliant with meds? yes       Allergies: No Known Drug Allergies  Anticoagulation Management History:      His anticoagulation is being managed by telephone today.  Positive risk factors for bleeding include an age of 41 years or older and presence of serious comorbidities.  The bleeding index is 'intermediate risk'.  Positive CHADS2 values include History of CHF, History of HTN, and Age > 63 years old.  His last INR was 0.9 ratio.  Prothrombin time is 27.8.  Anticoagulation responsible provider: Shirlee Latch MD, Tessie Ordaz.  INR POC: 2.83.    Anticoagulation Management Assessment/Plan:      The patient's current anticoagulation dose is Warfarin sodium 5 mg tabs: take as directed.  The target INR is 2.0-3.0.  The next INR is due 04/27/2010.  Anticoagulation instructions were given to spouse.  Results were reviewed/authorized by Bethena Midget, RN, BSN.  He was notified by Bethena Midget, RN, BSN.         Prior Anticoagulation Instructions: INR 3.26 LMIOM for pt to call for dosing. Bethena Midget, RN, BSN  April 01, 2010 10:59 AM Today take 2.5mg s then change dose to 5mg s daily except 2.5mg s on Wed and Sat. Recheck in 2 weeks. Orders sent to HD.   Current Anticoagulation Instructions: INR 2.83 Continue 5mg s daily except 2.5mg s on Wed and Sat. Recheck in 2 weeks. Orders sent to HD.

## 2010-04-26 ENCOUNTER — Telehealth: Payer: Self-pay | Admitting: Internal Medicine

## 2010-04-27 ENCOUNTER — Encounter: Payer: Self-pay | Admitting: Cardiology

## 2010-04-27 ENCOUNTER — Encounter: Payer: Self-pay | Admitting: Cardiovascular Disease

## 2010-04-29 ENCOUNTER — Encounter: Payer: Self-pay | Admitting: Cardiology

## 2010-04-29 ENCOUNTER — Encounter: Payer: Self-pay | Admitting: Cardiovascular Disease

## 2010-04-29 NOTE — Medication Information (Signed)
Summary: Physician's Orders   Physician's Orders   Imported By: Roderic Ovens 04/22/2010 09:41:30  _____________________________________________________________________  External Attachment:    Type:   Image     Comment:   External Document

## 2010-05-03 DIAGNOSIS — I4891 Unspecified atrial fibrillation: Secondary | ICD-10-CM

## 2010-05-05 NOTE — Medication Information (Addendum)
Summary: [facility] Clinic  Anticoagulant Therapy  Managed by: Cloyde Reams, RN, BSN Referring MD: Shirlee Latch PCP: Terrial Rhodes, MD Supervising MD: Eden Emms MD, Theron Arista Indication 1: Atrial Fibrillation Lab Used: HD [facility] [place] [facility] Site: [address] PT 27.6 INR POC 2.80 INR RANGE 2.0-3.0  Dietary changes: no     Bleeding/hemorrhagic complications: yes       Details: Last week had bleeding from graph site after dialysis, but none since.    Any changes in medication regimen? no     Any missed doses?: no       Is patient compliant with meds? yes       Allergies: No Known Drug Allergies  Anticoagulation Management History:      His anticoagulation is being managed by telephone today.  Positive risk factors for bleeding include an age of 75 years or older and presence of serious comorbidities.  The bleeding index is 'intermediate risk'.  Positive CHADS2 values include History of CHF, History of HTN, and Age > 75 years old.  His last INR was 0.9 ratio.  Prothrombin time is 27.6.  Anticoagulation responsible provider: Eden Emms MD, Theron Arista.  INR POC: 2.80.  Exp: 12/2010.    Anticoagulation Management Assessment/Plan:      The patient's current anticoagulation dose is Warfarin sodium 5 mg tabs: take as directed.  The target INR is 2.0-3.0.  The next INR is due 05/18/2010.  Anticoagulation instructions were given to spouse.  Results were reviewed/authorized by Cloyde Reams, RN, BSN.  He was notified by Cloyde Reams RN.         Prior Anticoagulation Instructions: INR 2.83 Continue 5mg s daily except 2.5mg s on Wed and Sat. Recheck in 2 weeks. Orders sent to HD.   Current Anticoagulation Instructions: INR 2.80  Called spoke with pt's wife, advised to continue on same dosage 5mg  daily except 2.5mg  on Wednesdays and Saturdays.  Recheck in 3 weeks.  Orders faxed  to HD.

## 2010-05-05 NOTE — Medication Information (Signed)
Summary: Physician's Orders   Physician's Orders   Imported By: Roderic Ovens 04/26/2010 12:02:35  _____________________________________________________________________  External Attachment:    Type:   Image     Comment:   External Document

## 2010-05-05 NOTE — Progress Notes (Signed)
Summary: prior auth  Phone Note Call from Patient   Caller: Patient Call For: Dr Leone Payor Reason for Call: Refill Medication, Talk to Nurse Summary of Call: Patient needs a prior auth for his Omeprazole, please call pt. Initial call taken by: Tawni Levy,  April 26, 2010 3:28 PM  Follow-up for Phone Call        Patients wife called and said that patient drug coverage changed to [facility]  and they need a new prior auth for patients Omeprazole.. I called [facility] at [phone] and spoke to [facility] to do the prior auth... Rosenne transfered me to [place] and Efraim Kaufmann said that she will fax a prior auth form for me to fill out and that will take 24-48 hours for me to recieve. Called patient wife back to let her know  Follow-up by: Ok Anis CMA,  April 26, 2010 3:36 PM

## 2010-05-18 ENCOUNTER — Encounter: Payer: Self-pay | Admitting: Cardiology

## 2010-05-20 ENCOUNTER — Encounter: Payer: Self-pay | Admitting: Cardiology

## 2010-05-20 NOTE — Medication Information (Signed)
Summary: RX Folder  RX Folder   Imported By: Earl Many 05/12/2010 17:11:35  _____________________________________________________________________  External Attachment:    Type:   Image     Comment:   External Document

## 2010-05-20 NOTE — Medication Information (Signed)
Summary: RX Folder  RX Folder   Imported By: Earl Many 05/13/2010 15:53:32  _____________________________________________________________________  External Attachment:    Type:   Image     Comment:   External Document

## 2010-05-25 NOTE — Medication Information (Signed)
Summary: [facility] Clinic  Anticoagulant Therapy  Managed by: Cloyde Reams, RN, BSN Referring MD: Shirlee Latch PCP: Terrial Rhodes, MD Supervising MD: Eden Emms MD, Theron Arista Indication 1: Atrial Fibrillation Lab Used: HD [facility] [place] Site: [address] PT 27.0 INR POC 2.72 INR RANGE 2.0-3.0  Dietary changes: no    Health status changes: no    Bleeding/hemorrhagic complications: no    Recent/future hospitalizations: no    Any changes in medication regimen? no    Recent/future dental: no  Any missed doses?: no       Is patient compliant with meds? yes       Allergies: No Known Drug Allergies  Anticoagulation Management History:      His anticoagulation is being managed by telephone today.  Positive risk factors for bleeding include an age of 75 years or older and presence of serious comorbidities.  The bleeding index is 'intermediate risk'.  Positive CHADS2 values include History of CHF, History of HTN, and Age > 67 years old.  His last INR was 0.9 ratio.  Prothrombin time is 27.0.  Anticoagulation responsible provider: Eden Emms MD, Theron Arista.  INR POC: 2.72.    Anticoagulation Management Assessment/Plan:      The patient's current anticoagulation dose is Warfarin sodium 5 mg tabs: take as directed.  The target INR is 2.0-3.0.  The next INR is due 06/08/2010.  Anticoagulation instructions were given to spouse.  Results were reviewed/authorized by Cloyde Reams, RN, BSN.  He was notified by Windell Hummingbird, RN.         Prior Anticoagulation Instructions: INR 2.80  Called spoke with pt's wife, advised to continue on same dosage 5mg  daily except 2.5mg  on Wednesdays and Saturdays.  Recheck in 3 weeks.  Orders faxed  to HD.   Current Anticoagulation Instructions: INR 2.7  Attempted to call pt.  LMOM to call back for results. Cloyde Reams RN  May 20, 2010 9:17 AM  Spoke w/ patient's wife, asked follow-up questions, gave results, and will recheck in 3 weeks.  Windell Hummingbird, RN, BSN  May 20, 2010 11:20 AM  Orders faxed to HD to redraw INR on 06/08/2010. Windell Hummingbird, RN  May 20, 2010 12:07 PM

## 2010-06-08 LAB — PROTIME-INR: INR: 2.2 — AB (ref 0.9–1.1)

## 2010-06-09 ENCOUNTER — Other Ambulatory Visit: Payer: Self-pay

## 2010-06-09 MED ORDER — WARFARIN SODIUM 5 MG PO TABS: 5.0000 mg | ORAL_TABLET | ORAL | Status: DC

## 2010-06-10 ENCOUNTER — Ambulatory Visit (INDEPENDENT_AMBULATORY_CARE_PROVIDER_SITE_OTHER): Payer: Self-pay | Admitting: Internal Medicine

## 2010-06-10 DIAGNOSIS — Z7901 Long term (current) use of anticoagulants: Secondary | ICD-10-CM | POA: Insufficient documentation

## 2010-06-10 NOTE — Patient Instructions (Signed)
Continue on same dosage 5mg  daily except 2.5mg  on Wednesdays and Saturdays.  Recheck in 4 weeks.  Orders faxed to [facility].

## 2010-06-24 LAB — CBC
HCT: 30.6 % — ABNORMAL LOW (ref 39.0–52.0)
Hemoglobin: 10 g/dL — ABNORMAL LOW (ref 13.0–17.0)
Hemoglobin: 10.4 g/dL — ABNORMAL LOW (ref 13.0–17.0)
MCHC: 33.6 g/dL (ref 30.0–36.0)
MCHC: 33.8 g/dL (ref 30.0–36.0)
MCV: 102.6 fL — ABNORMAL HIGH (ref 78.0–100.0)
MCV: 101.6 fL — ABNORMAL HIGH (ref 78.0–100.0)
Platelets: 137 10*3/uL — ABNORMAL LOW (ref 150–400)
Platelets: 156 10*3/uL (ref 150–400)
RBC: 2.92 MIL/uL — ABNORMAL LOW (ref 4.22–5.81)
RBC: 2.99 MIL/uL — ABNORMAL LOW (ref 4.22–5.81)
RBC: 3.12 MIL/uL — ABNORMAL LOW (ref 4.22–5.81)
WBC: 9.1 10*3/uL (ref 4.0–10.5)
WBC: 10.3 10*3/uL (ref 4.0–10.5)

## 2010-06-24 LAB — POCT I-STAT 3, ART BLOOD GAS (G3+)
O2 Saturation: 91 %
pCO2 arterial: 41.9 mmHg (ref 35.0–45.0)
pO2, Arterial: 65 mmHg — ABNORMAL LOW (ref 80.0–100.0)

## 2010-06-24 LAB — CK TOTAL AND CKMB (NOT AT ARMC)
CK, MB: 0.7 ng/mL (ref 0.3–4.0)
CK, MB: 0.8 ng/mL (ref 0.3–4.0)
Total CK: 23 U/L (ref 7–232)
Total CK: 91 U/L (ref 7–232)

## 2010-06-24 LAB — PHOSPHORUS
Phosphorus: 5.1 mg/dL — ABNORMAL HIGH (ref 2.3–4.6)
Phosphorus: 5.5 mg/dL — ABNORMAL HIGH (ref 2.3–4.6)

## 2010-06-24 LAB — PROTIME-INR: INR: 1.1 (ref 0.00–1.49)

## 2010-06-24 LAB — COMPREHENSIVE METABOLIC PANEL
ALT: 17 U/L (ref 0–53)
AST: 21 U/L (ref 0–37)
Alkaline Phosphatase: 80 U/L (ref 39–117)
CO2: 22 mEq/L (ref 19–32)
Calcium: 8.3 mg/dL — ABNORMAL LOW (ref 8.4–10.5)
GFR calc Af Amer: 11 mL/min — ABNORMAL LOW (ref >60)
GFR calc non Af Amer: 9 mL/min — ABNORMAL LOW (ref >60)
Potassium: 3.9 mEq/L (ref 3.5–5.1)
Sodium: 137 mEq/L (ref 135–145)
Total Protein: 5.2 g/dL — ABNORMAL LOW (ref 6.0–8.3)

## 2010-06-24 LAB — POCT CARDIAC MARKERS
CKMB, poc: <1 ng/mL — ABNORMAL LOW (ref 1.0–8.0)
Troponin i, poc: <0.05 ng/mL (ref 0.00–0.09)

## 2010-06-24 LAB — IRON AND TIBC
Iron: 32 ug/dL — ABNORMAL LOW (ref 42–135)
UIBC: 162 ug/dL

## 2010-06-24 LAB — HEPATITIS B SURFACE ANTIGEN: Hepatitis B Surface Ag: NEGATIVE

## 2010-06-24 LAB — BASIC METABOLIC PANEL
GFR calc Af Amer: 10 mL/min — ABNORMAL LOW (ref >60)
GFR calc non Af Amer: 8 mL/min — ABNORMAL LOW (ref >60)
Potassium: 4.2 mEq/L (ref 3.5–5.1)
Sodium: 138 mEq/L (ref 135–145)

## 2010-06-24 LAB — TROPONIN I
Troponin I: 0.01 ng/mL (ref 0.00–0.06)
Troponin I: 0.01 ng/mL (ref 0.00–0.06)
Troponin I: 0.02 ng/mL (ref 0.00–0.06)

## 2010-06-24 LAB — HOMOCYSTEINE: Homocysteine: 13 umol/L (ref 4.0–15.4)

## 2010-06-24 LAB — POCT I-STAT, CHEM 8
BUN: 63 mg/dL — ABNORMAL HIGH (ref 6–23)
Chloride: 104 mEq/L (ref 96–112)
Creatinine, Ser: 5.9 mg/dL — ABNORMAL HIGH (ref 0.4–1.5)
Glucose, Bld: 118 mg/dL — ABNORMAL HIGH (ref 70–99)
Potassium: 4.5 mEq/L (ref 3.5–5.1)

## 2010-06-24 LAB — DIFFERENTIAL
Lymphocytes Relative: 16 % (ref 12–46)
Lymphs Abs: 1.6 10*3/uL (ref 0.7–4.0)
Monocytes Relative: 8 % (ref 3–12)
Neutro Abs: 7.1 10*3/uL (ref 1.7–7.7)
Neutrophils Relative %: 69 % (ref 43–77)

## 2010-06-24 LAB — RETICULOCYTES
RBC.: 3.18 MIL/uL — ABNORMAL LOW (ref 4.22–5.81)
Retic Count, Absolute: 165.4 10*3/uL (ref 19.0–186.0)

## 2010-06-24 LAB — POCT I-STAT 3, VENOUS BLOOD GAS (G3P V)
O2 Saturation: 68 %
pCO2, Ven: 38.9 mmHg — ABNORMAL LOW (ref 45.0–50.0)

## 2010-06-24 LAB — PTH, INTACT AND CALCIUM: PTH: 74.9 pg/mL — ABNORMAL HIGH (ref 14.0–72.0)

## 2010-06-24 LAB — TSH: TSH: 1.178 u[IU]/mL (ref 0.350–4.500)

## 2010-06-24 LAB — BRAIN NATRIURETIC PEPTIDE: Pro B Natriuretic peptide (BNP): 1786 pg/mL — ABNORMAL HIGH (ref 0.0–100.0)

## 2010-06-24 LAB — LIPID PANEL
Cholesterol: 100 mg/dL (ref 0–200)
LDL Cholesterol: 62 mg/dL (ref 0–99)
Total CHOL/HDL Ratio: 4.8 RATIO

## 2010-07-09 LAB — PROTIME-INR: INR: 2.6 — AB (ref <=1.1)

## 2010-07-12 ENCOUNTER — Ambulatory Visit (INDEPENDENT_AMBULATORY_CARE_PROVIDER_SITE_OTHER): Payer: Self-pay | Admitting: Cardiology

## 2010-07-12 DIAGNOSIS — Z7901 Long term (current) use of anticoagulants: Secondary | ICD-10-CM

## 2010-07-27 NOTE — Assessment & Plan Note (Signed)
OFFICE VISIT   Casey [name], Casey [name]  DOB:  25-Jun-1931                                       12/19/2007  EAVWU#:[id number]   The patient returns for followup today.  He recently was seen on  September 16 with cervical adenopathy.  He was placed on Augmentin for  this.  This has completely resolved.  We are also following him for a  left brachiocephalic AV fistula.   EXAM:  There is a palpable thrill in the fistula and an audible bruit.  However, the fistula has not dilated much since his last visit on  September 16.  His wife states that he has essentially not been  exercising it at all.   He is currently dialyzing via left-sided Diatek catheter without  difficulty.   I believe we should continue to wait a few more weeks before cannulating  the fistula.  He will try to exercise it some at this point.  We will  recheck him in 1 month.  Hopefully, the fistula will be ready to use at  that time.   Janetta Hora. Fields, MD  Electronically Signed   CEF/MEDQ  D:  12/19/2007  T:  12/20/2007  Job:  [id number]   cc:   Terrial Rhodes, M.D.

## 2010-07-27 NOTE — Cardiovascular Report (Signed)
NAME:  Casey [name], MILLEA NO.:  [id number]   MEDICAL RECORD NO.:  [id number]           PATIENT TYPE:   LOCATION:                                 FACILITY:   PHYSICIAN:  Marca Ancona, MD      DATE OF BIRTH:  November 10, 1931   DATE OF PROCEDURE:  05/29/2008  DATE OF DISCHARGE:                            CARDIAC CATHETERIZATION   PROCEDURES:  1. Left heart catheterization.  2. Right heart catheterization.  3. Coronary angiography.  4. Left ventriculography.  5. Abdominal angiography.   INDICATIONS:  Congestive heart failure.  The patient with end-stage  renal disease.  He did have an episode of chest pain prior to admission  and also was thought to have inferior hypokinesis on his echocardiogram.   PROCEDURE NOTE:  After informed consent was obtained, the right groin  was sterilely prepped and draped.  The right common femoral vein was  entered using Seldinger technique and a 7-French venous sheath was  placed.  The right common femoral artery was engaged using Seldinger  technique and a 6-French arterial sheath was placed.  The right heart  catheterization was carried out using the Swan-Ganz catheter.  Sample  was removed from the pulmonary artery for oxygen saturation.  Samples  were also removed from the femoral artery for oxygen saturation.  Left  heart catheterization was then carried out.  The left external iliac  artery was found to be extremely tortuous.  It was difficult to pass a  wire, therefore the sheath was changed out for a long sheath and then  wire and catheter were passed without difficulty.  The left coronary  artery was engaged with the 6-French JL4 catheter.  The right coronary  artery was engaged with the 6-French JR4 catheter and the left ventricle  was entered using the 6-French angled pigtail catheter.  Aortogram was  also performed using 6-French angled pigtail catheter.   FINDINGS:  1. Hemodynamics.  Mean right atrial pressure is 13 mmHg,  RV 29/8, PA      38/16 for a mean PA pressure of 28 mmHg.  Mean pulmonary capillary      wedge pressure of 15 mmHg.  LV 142/6/17.  Aorta 120/47.  Mixed      venous O2 saturation is 68%.  Cardiac output 8.3.  Cardiac index      4.1.  2. Abdominal aortogram.  This showed bilateral renal artery proximal      occlusions.  There was prominent atherosclerotic plaque in the      infrarenal aorta.  There was also prominent tortuosity in the left      external iliac artery, but no flow-limiting stenosis in the iliac      system.  3. Left ventriculography.  This showed EF of 50% with mild global      hypokinesis.  At most, there was mild mitral regurgitation.  4. Coronary angiography.  The coronary system was right dominant.  The      RCA was a large artery.  There was a mild 30% ostial RCA stenosis.      There were some mild luminal  irregularities in body of the RCA.      Left main was free from significant disease.  There was a mild 30%      ostial LAD stenosis and a 40% stenosis at the ostium of a large      first diagonal.  Otherwise, there was no significant disease to      LAD.  The circumflex was a small vessel with no significant      coronary disease.   ASSESSMENT/PLAN:  Coronary angiography shows nonobstructive coronary  disease.  There is minimal mitral regurgitation.  The left heart filling  pressure is mildly elevated and the right heart filling pressure is  mildly to moderately elevated.  We will suggest continued aspirin 81 mg  a day and continued gentle volume removal with hemodialysis.      Marca Ancona, MD  Electronically Signed     DM/MEDQ  D:  05/29/2008  T:  05/29/2008  Job:  [id number]

## 2010-07-27 NOTE — Discharge Summary (Signed)
NAME:  Casey [name], Casey [name] NO.:  [id number]   MEDICAL RECORD NO.:  [id number]          PATIENT TYPE:  INP   LOCATION:  3733                         FACILITY:  [facility]   PHYSICIAN:  Eduard Clos, MDDATE OF BIRTH:  [birth year]   DATE OF ADMISSION:  05/26/2008  DATE OF DISCHARGE:  05/29/2008                               DISCHARGE SUMMARY   COURSE IN THE HOSPITAL:  This is a 77 year old male with known history  of hypertension; ESRD, on hemodialysis who presented with complaint of  shortness of breath.  On admission, the patient had a chest x-ray, which  showed cardiac enlargement, tortuous ectatic thoracic aorta, mild  vascular congestion with a BNP level of 3200.  The patient was admitted  to medical floor.  Serial enzymes were followed.  Nephrology and  Cardiology were consulted.  The patient underwent dialysis per  Nephrology and had a cardiac cath, which showed normal coronaries with  an EF of 50%.  The patient's symptoms had improved during this stay with  dialysis, and at discharge, the patient's carvedilol was stopped and  lisinopril started.  The patient also had a 4-cm lung lesion in the  chest x-ray on the left upper lobe.  CT chest was done, which did not  show any significant finding except for tiny peripheral primary nodules  likely part of reticulonodular interstitial process.  Followup CT chest  is recommended in 6 months.  The patient has been advised about the  same.  At the time of this dictation, the patient is hemodynamically  stable.   PROCEDURES DONE DURING THIS STAY:  1. Cardiac cath, which showed normal nonobstructive coronary arteries      with an ejection fraction of 50%.  2. Dialysis.  3. Chest x-ray on May 26, 2008, showed cardiac enlargement, tortuous      ectatic thoracic aorta, mild vascular congestion without overt      pulmonary edema.  There is streaky bibasilar atelectasis and a      small right effusion, new left upper lobe  lung lesion measuring      approximately 4 cm, chest CT suggested.  4. CT chest without contrast on May 26, 2008, shows bilateral      pleural effusion, right greater than left with overlying      atelectasis, areas of loculation on the left found for chest x-ray      abnormality taken, aortic and coronary artery calcification, tiny      peripheral primary nodules likely part of reticulonodular      interstitial process.  Followup chest CT in 6 months is suggested      to reevaluate.  No pulmonary edema or focal pulmonary infiltrate.   FINAL DIAGNOSES:  1. Diastolic heart failure.  2. Pulmonary nodules.  3. End-stage renal disease, on hemodialysis.  4. Hypertension.   MEDICATIONS AT DISCHARGE:  1. Omeprazole 20 mg p.o. b.i.d.  2. Paroxetine 10 mg p.o. daily.  3. Nephro-Vite 1 tablet p.o. daily.  4. Hydrocodone and acetaminophen 5/325 mg p.o. q.6 p.r.n. for pain.  5. Stool softener as taken before.  6. Lisinopril 10 mg  p.o. at bedtime.   PLAN:  The patient will be discharged home after the patient ambulates.  Dialysis as scheduled by nephrologist.  To follow with his primary care  physician within a week's time.  The patient is to be on a cardiac  healthy, carb-modified diet.      Eduard Clos, MD  Electronically Signed     ANK/MEDQ  D:  05/29/2008  T:  05/30/2008  Job:  [id number]

## 2010-07-27 NOTE — H&P (Signed)
NAME:  Casey [name], Casey [name] NO.:  [id number]   MEDICAL RECORD NO.:  [id number]          PATIENT TYPE:  EMS   LOCATION:  ED                           FACILITY:  [facility]   PHYSICIAN:  Maree Krabbe, M.D.DATE OF BIRTH:  10-Jan-1932   DATE OF ADMISSION:  09/23/2007  DATE OF DISCHARGE:                              HISTORY & PHYSICAL   PRESENTING COMPLAINT:  Abdominal pain.   HISTORY:  This is 77 year old white male with history of end-stage renal  disease due to hypertension on peritoneal dialysis for about 4 years.  He is followed by [facility].  He came to the emergency  room with abdominal pain.  The patient did well on peritonitis for 3.5  years initially, then in February of this year developed peritonitis,  which I believe was due to methicillin sensitive staph EPI.  He then had  another episode of peritonitis in April, and I do not know the organism  from that episode.  Then, he had developed peritonitis again on June 9th  and according to the on-call peritoneal dialysis nurse has had  peritonitis off and on since then without clear resolution.  His most  recent culture came back 2 days ago positive for yeast, and he was  started on oral Diflucan 2 days ago, and has been taking it  appropriately.  He now presents with abdominal pain and cloudy fluid.  Notably, also, the patient complains of severe and progressive fatigue,  anorexia, and malaise, nausea and vomiting over the past 6 to 8 months.  The nausea and vomiting have not been going on for that long, but the  malaise, fatigue, and then the anorexia and severe weakness have been  progressive.   PAST MEDICAL HISTORY:  1. Hypertension.  2. End-stage renal disease.  3. He denies any history of heart disease.   He is a nonsmoker and a nondrinker.  He lives with his wife at home.   REVIEW OF SYSTEMS:  GENERAL:  Denies fever, chills, sweats.  He has had  some weight loss.  ENT:  There is no  hearing loss, visual change, sore  throat, or difficulty swallowing.  He does state he has a hiatal hernia  and had some problems with pills occasionally.  CARDIOPULMONARY:  He  denies any shortness of breath, cough, orthopnea, PND, chest pain, or  history of angina or MI.  GI:  Denies any GI as above.  GU:  He makes  small amounts of urine with no difficulty.  MUSCULOSKELETAL:  Denies  myalgia, arthralgia, or ankle swelling.  He does have some poor  circulation in both feet and has had difficulty driving due to this.  NEUROLOGIC:  Denies focal numbness or weakness.  No history of stroke,  TIA, or seizure.   PHYSICAL EXAMINATION:  VITAL SIGNS:  Blood pressure 120/80.  The patient  is afebrile.  GENERAL:  He looks somewhat disheveled and unshaven.  He is alert and  oriented.  He looks weak.  Notably, he has uremic fetor.  He is oriented  x3.  SKIN:  Without rash.  HEENT:  PERRL,  EOMI, throat is slightly dry.  NECK:  Supple with flat neck veins and no bruits.  There is a  transmitted murmur that goes to both sides of the neck.  CHEST:  Clear throughout.  CARDIAC:  Regular rate and rhythm with a 2/6 systolic ejection murmur at  the right upper sternal border.  ABDOMEN:  Soft, minimally tender in the upper and lower quadrants  bilateral.  A PD cath in the right lower quadrant has a clean exit site  with no drainage, no tunnel tenderness or edema.  EXTREMITIES:  No peripheral edema.  NEUROLOGIC:  No asterixis.   LABORATORY DATA:  White blood count 30,000, hemoglobin 11, platelets  normal.  Potassium 2.9, BUN 44, creatinine 5.6, albumin 1.9, calcium  8.9.  Abdominal x-ray:  Nonspecific bowel-gas pattern with a small  amount of air due to PD technique most likely.   MEDICATIONS:  Atenolol, NephroVite, bupropion, lactulose p.r.n.,  Dulcolax p.r.n., hydrocodone p.r.n.   IMPRESSION:  1. Fungal peritonitis; the patient needs the peritoneal dialysis      catheter removed.  Will start  intravenous Diflucan.  I have talked      to Dr. Johna Sheriff, General Surgery, who is going to see the patient,      and hopefully this can be done within the next 24 hours.  In the      meantime, we will add also Vancomycin and Fortaz until the cultures      are back on the peritoneal dialysis fluid.  2. End-stage renal disease; the patient has symptoms of under-dialysis      with failure to thrive over the last 6 to 8 months, uremic fetor,      nausea and vomiting, malaise, and severe anorexia.  I suspect he      has failure of peritoneal dialysis to dialyze him effectively and I      have recommended a trial of hemodialysis, and he agrees to try it.  3. Leukocytosis likely secondary to number one.  We will also check      urine and chest x-ray.  4. History of hypertension on atenolol.   PLAN:  1. Admit.  Will do a CCPD overnight, get blood cultures, urine      culture, chest x-ray, obtain a General Surgery consult for PD      catheter removal, hopefully tomorrow.  2. IV Diflucan plus vanc and Fortaz.  3. The patient will mostl likely need a temporary hemodialysis      catheter placed for hemodialysis for the next few days and then      have a Diatech placed after the abdominal infection is improving.      Discussed at length with patient and his wife who agree to proceed.      Maree Krabbe, M.D.  Electronically Signed     RDS/MEDQ  D:  09/23/2007  T:  09/23/2007  Job:  [id number]

## 2010-07-27 NOTE — Consult Note (Signed)
NAME:  BRIANA, NEWMAN            ACCOUNT NO.:  [id number]   MEDICAL RECORD NO.:  [id number]          PATIENT TYPE:  INP   LOCATION:  6741                         FACILITY:  [facility]   PHYSICIAN:  Sharlet Salina T. Hoxworth, M.D.DATE OF BIRTH:  12-22-1931   DATE OF CONSULTATION:  09/23/2007  DATE OF DISCHARGE:                                 CONSULTATION   CHIEF COMPLAINT:  Abdominal pain, nausea, and vomiting.   HISTORY OF PRESENT ILLNESS:  Mr. Quadros is a 77 year old white male  with history of end-stage renal disease secondary to hypertension.  He  had a peritoneal dialysis catheter placed in 2005 and has been on  peritoneal dialysis since.  He has done well until this year, but has  had frequently recurrent peritonitis treated 3 times since February of  this year.  Two days ago, he developed cloudy fluid, again was started  on Diflucan with initial cultures showed yeast.  However, he has  developed increasing diffuse abdominal pain over the last couple of days  with anorexia then nausea and vomiting.  He is somewhat constipated,  which is chronic for him as well.  He presented to the emergency room  today.   PAST MEDICAL HISTORY:  Surgery as above plus a Diatek catheter was  placed in 2005, but he currently has no vascular access.  Medically, he  was followed for hypertension and depression.   MEDICATIONS:  Atenolol, Rena-Vite, bupropion, and lactulose.   ALLERGIES:  None.   SOCIAL HISTORY:  Denies cigarettes and alcohol.   FAMILY HISTORY:  Noncontributory.   REVIEW OF SYSTEMS:  GENERAL:  Denies fever or chills.  He does feel  weak.  RESPIRATORY:  He has some dyspnea on exertion about 50 feet,  which is worse with his abdominal pain.  CARDIOVASCULAR:  No chest pain,  palpitations, edema, or history of heart disease.  ABDOMEN:  GI as  above.   PHYSICAL EXAMINATION:  VITAL SIGNS:  Temperature 97.8, pulse 65,  respirations 20, and blood pressure 131/59.  GENERAL:  He is alert,  pleasant white male who does not appear in acute  distress or toxic.  SKIN:  Warm and dry.  HEENT:  No mass or thyromegaly.  Sclerae nonicteric.  Oropharynx clear.  LYMPH NODES:  No supraclavicular, axillary, or inguinal nodes palpable.  LUNGS:  Clear without wheezing or increased work of breathing.  CARDIAC:  Regular rate and rhythm.  No murmurs.  No edema.  ABDOMEN:  PD catheter in the right lower quadrant.  Exit site looks  okay.  There is moderate diffuse abdominal tenderness with some  guarding.  No discernible masses or organomegaly.  EXTREMITIES:  No joint swelling deformity.  NEUROLOGIC:  Alert and oriented.  Motor and sensory exam is grossly  normal.   LABORATORY AND X-RAYS:  White count is elevated at 30.7 and hemoglobin  11.3.  Electrolytes were normal.  BUN 44 and creatinine 5.6.  Peritoneal  dialysis, cell counts, and cultures are pending.  KUB shows mild ileus  only.   ASSESSMENT AND PLAN:  Persistent or recurrent peritonitis related to  peritoneal dialysis and now with  yeast on recent culture.  The patient  is being admitted to the renal service.  He will need his catheter  removed.  We will plan this tomorrow after he is n.p.o. tonight.      Lorne Skeens. Hoxworth, M.D.  Electronically Signed     BTH/MEDQ  D:  09/23/2007  T:  09/24/2007  Job:  [id number]

## 2010-07-27 NOTE — Consult Note (Signed)
NAME:  Casey [name], Casey [name]            ACCOUNT NO.:  [id number]   MEDICAL RECORD NO.:  [id number]          PATIENT TYPE:  INP   LOCATION:  3733                         FACILITY:  [facility]   PHYSICIAN:  Noralyn Pick. Eden Emms, MD, FACCDATE OF BIRTH:  [birth year]   DATE OF CONSULTATION:  DATE OF DISCHARGE:                                 CONSULTATION   This is a 77-year patient we were asked to see regarding possible heart  failure.   The patient apparently has been seen by me 3-4 years ago.  There are no  records in [place].   He has longstanding kidney failure.  He was on peritoneal dialysis for 3-  4 years but had a fungal infection.  He has been on regular hemodialysis  for about a year.  Over the last 2-3 weeks, he has had increasing  shortness of breath.  He has been very poor with his diet taking in lots  of extra salt.  He does not have a previous history of heart failure.   He had an echocardiogram done today, which shows an EF of 50% with mild-  to-moderate MR.  There is a question of an inferior wall motion  abnormality.   About a week ago, the patient had a little bit of pressure on his chest.  He described it as having a sac of sugar on their.  This was after  dialysis and generally, he has been tolerating his dialysis supine.  His  weight has not been elevated.  He had an abnormal chest x-ray on  admission suggesting a left upper lobe lesion measuring 4 cm.  Followup  CT scan of the chest suggested tiny bilateral pulmonary nodules and no  evidence of cancer.  There is no pulmonary edema or focal infiltrates.   He does have small bilateral pleural effusions.   The patient's BNP was elevated at 1786.  His enzymes have been negative.  He is currently not having chest pain.  His review of systems is,  otherwise, remarkable for significant fatigue and exertional dyspnea.  His dyspnea has been worsening over the last 6 months to a year.   His past medical history is, otherwise,  remarkable for:  1. Chronic renal failure on dialysis.  2. Hypertension.  3. GERD.  4. Depression.  5. History of peritonitis and fungal infection.   He has no known allergies.   He is retired.  He lives at home with his wife in [place].  He has  limited activity due to dyspnea.  He does not smoke or drink.  As  indicated, his diet has been poor in regards to salt intake.   His medicines on admission include:  1. Carvedilol 25 b.i.d.  2. Omeprazole 20 a day.  3. Paroxetine 10 a day.  4. Nephro-Vite stool softener.   Family history is remarkable for hypertension and diabetes.   PHYSICAL EXAMINATION:  GENERAL:  Remarkable for chronic ill-appearing  male in no distress.  VITAL SIGNS:  Blood pressure 150/60, pulse 70 and regular, respiratory  rate 14, afebrile, room air sats 96%.  HEENT:  Unremarkable.  NECK:  Carotids are normal without bruit.  No lymphadenopathy,  thyromegaly, JVP elevation.  LUNGS:  Inspiratory crackles at the bases.  There is no S1 and S2 with a  systolic murmur.  PMI is normal.  ABDOMEN:  Benign.  He is status post peritoneal dialysis catheter.  There is no AAA, no tenderness, no bruit, no hepatosplenomegaly, no  hepatojugular reflux tenderness.  EXTREMITIES:  Distal pulses are intact.  No edema.  NEURO:  Nonfocal.  SKIN:  Warm and dry.  MUSCULOSKELETAL:  No muscular weakness.  He has a shunt in the left  upper extremity with a good thrill.   His electrocardiogram shows sinus rhythm with no acute changes.  There  is no LVH that is obvious.  His telemetry showed 5 beats of nonsustained  VT.   Chest x-ray was reviewed as indicated in the HPI.  He has some  calcification in his aorta.  There is small bilateral pleural effusions  with no overt CHF and a question of a nodule on the left upper lobe.   Lab work is, otherwise, remarkable for CBC showing a hematocrit of 32.8,  platelets 137.  CPK and troponins were negative.  BNP was 1786.   His I-stat  creatinine was 5.9, potassium was 4.5.   IMPRESSION:  1. Episode of chest pressure with evidence for volume overload.  This      may be secondary to dietary indiscretion, but the patient does have      mildly decreased ejection fraction by echocardiogram with a      possible wall motion abnormality.  There is significant coronary      calcifications on his CT scan.  I talked to [facility] at length and      recommended a right and left heart cath to be done tomorrow.  I      think it will be important in regards to further assessing the      degree of mitral regurgitation ruling out coronary artery disease      and assessing the adequacy of his dialysis in terms of volume      status.  He is willing to proceed.  2. Hypertension, currently well controlled.  Continue current      medications.  3. Dialysis.  The patient's normal dialysis day is tomorrow.  We will      either try to catheterization him and then proceed with dialysis in      the afternoon or have him finish his dialysis and catheterization      him afterwards.  My preference would be to catheterization him and      then dialyze him after his catheterization, so he is not that weak      or hypotensive during his catheterization.  For the time being, I      would continue aspirin therapy.  I do not think that he needs      heparin.  Volume status will be controlled primarily by dialysis      since he is a renal failure patient.  4. Further recommendations will be based on the results of his right      and left heart catheterization.      Noralyn Pick. Eden Emms, MD, [facility]  Electronically Signed     PCN/MEDQ  D:  05/28/2008  T:  05/29/2008  Job:  [id number]

## 2010-07-27 NOTE — Op Note (Signed)
NAME:  Casey [name], Casey [name] NO.:  [id number]   MEDICAL RECORD NO.:  [id number]          PATIENT TYPE:  INP   LOCATION:  6741                         FACILITY:  [facility]   PHYSICIAN:  Cherylynn Ridges, M.D.    DATE OF BIRTH:  [birth year]   DATE OF PROCEDURE:  09/24/2007  DATE OF DISCHARGE:                               OPERATIVE REPORT   PREOPERATIVE DIAGNOSIS:  Infected peritoneal dialysis catheter.   POSTOPERATIVE DIAGNOSIS:  Infected peritoneal dialysis catheter.   PROCEDURE:  Removal of infected peritoneal dialysis catheter.   SURGEON:  Cherylynn Ridges, MD.   ANESTHESIA:  General endotracheal.   ESTIMATED BLOOD LOSS:  Less than 20 mL.   COMPLICATIONS:  None.   CONDITION:  Stable.   FINDINGS:  Normally incorporated peritoneal dialysis catheter without  any obvious pus.   OPERATION:  The patient was taken to the operating room and placed on  the table in supine position.  After an adequate general endotracheal  anesthetic was administered, he was prepped and draped in the usual  sterile manner exposing the area of the peritoneal dialysis in the right  rectus area.   The site from the previous longitudinal incision or linear incision was  used for dissecting down to the insertion site of the catheter through  the rectus sheath.  We made an incision using #15 blade and dissected  down to the fascial level where the catheter was below the fascia.  We  incised the fascia transversely and dissected out the Teflon cuff and  then also the silicone bulb which was below the fascial level.  We were  able to remove the pigtail portion of the catheter from the peritoneal  area with some leakage of the dialysis fluid.  The catheter at the skin  level was cut at the skin and then we dissected out the Teflon cuff  which was underneath the skin in the subcutaneous tissue from its  surrounding tissue.  With both pieces detached, we removed the catheter  and the bulb and all both  Teflon cuffs without problem and discarded it  off the field.   We reapproximated the rectus sheath using interrupted #1 Novofil  sutures.  Approximately, 7 were used.  There was minimal leak of the  peritoneal fluid once the sutures were in place.  3-0 Vicryl interrupted  subcu stitches were placed in the subcu and then skin was closed using  stainless steel staples.  All needle, sponge counts, and instrument  counts were correct.  A sterile dressing was applied.      Cherylynn Ridges, M.D.  Electronically Signed     JOW/MEDQ  D:  09/24/2007  T:  09/25/2007  Job:  [id number]   cc:   Maree Krabbe, M.D.

## 2010-07-27 NOTE — H&P (Signed)
NAME:  Casey [name], Casey [name] NO.:  [id number]   MEDICAL RECORD NO.:  [id number]          PATIENT TYPE:  INP   LOCATION:  3733                         FACILITY:  [facility]   PHYSICIAN:  Lonia Blood, M.D.      DATE OF BIRTH:  09/25/1931   DATE OF ADMISSION:  05/26/2008  DATE OF DISCHARGE:                              HISTORY & PHYSICAL   PRIMARY CARE PHYSICIAN:  The patient is unassigned.  He only sees Dr.  Einar Gip.   PRESENTING COMPLAINT:  Shortness of breath.   HISTORY OF PRESENT ILLNESS:  The patient is a 77 year old male with end-  stage renal disease who was previously on peritoneal dialysis but for  the past 4 years has been on hemodialysis.  He presented with  intermittent shortness of breath between his hemodialysis over the past  month that has apparently gotten worse now.  His breath is getting  shorter and shorter but improves somewhat with hemodialysis.  The  patient and daughter are worried that he may be having congestive heart  failure.  He has dialysis Tuesdays, Thursdays and Saturdays.  Denied any  chest pain, occasional cough.  Denied any fever, no orthopnea.  No PND.   PAST MEDICAL HISTORY:  1. Significant for hypertension.  2. End-stage renal disease on dialysis Tuesdays, Thursdays and      Saturdays.  3. GERD.  4. Depression.  5. History of peritonitis from his peritoneal dialysis.   ALLERGIES:  He has no known drug allergies.   MEDICATIONS:  1. Include carvedilol 25 mg p.o. b.i.d.  2. Omeprazole 20 mg p.o. b.i.d.  3. Paroxetine 10 mg daily.  4. Nephro-Vite 1 tablet daily.  5. Hydrocodone/acetaminophen 5/325 as needed.  6. Stool softener 100 mg daily.   SOCIAL HISTORY:  The patient lives in [place], [place].  He denied  any tobacco, alcohol or IV drug use.   FAMILY HISTORY:  Mainly hypertension, diabetes.   REVIEW OF SYSTEMS:  Twelve-point review of systems is negative except  for HPI.   EXAMINATION:  Temperature 98.1, blood pressure  152/63, pulse 70,  respiratory 18, saturation 96% on room air.  GENERAL:  The patient is awake, alert, oriented, pleasant, in no acute  distress.  HEENT:  PERRL.  EOMI.  NECK:  Supple.  No JVD, no lymphadenopathy.  RESPIRATORY:  He has good air entry bilateral.  No wheezes or rales.  Mild crackles at the bases.  CARDIOVASCULAR SYSTEM:  S1-S2, no murmur.  ABDOMEN:  Soft, nontender with positive bowel sounds.  EXTREMITIES:  No edema, cyanosis or clubbing.   LABORATORIES:  White count is 10.3, hemoglobin 10.8, platelet count 177  with normal differentials.  Sodium 139, potassium 4.5, chloride 104, BUN  63, creatinine 5.9, glucose 118, calcium 1.13 ionized.  BNP more than  3200.  Chest x-ray showed cardiac enlargement, tortuous ectatic thoracic  aorta, mild vascular congestion without overt pulmonary edema.  There is  streaky bibasilar atelectasis and a small right effusion, new left upper  lobe lung lesion measuring about 4 cm.  Chest CT was suggested for  further evaluation.   ASSESSMENT:  Therefore, the patient is a 77 year old gentleman  presenting with intermittent shortness of breath between dialysis.  I  suppose this is a patient getting frequent pulmonary edema prior to  dialysis from his worsening renal insufficiency.  Per patient, even  though he is on dialysis he still makes urine, but he has noted a  decline in his urination.  Other possibilities are the patient truly has  a CHF.  His albumin may be low also.   PLAN:  1. Shortness of breath:  We will admit the patient and get a 2D echo.      Continue his hemodialysis, probably put some high-dose diuresis and      then observe patient's response.  If his 2D echo showed any      congestive heart failure, we will get cardiology to follow up with      the patient.  2. Possible mass in the upper left upper lung:  We will get a chest CT      to further characterize the lesion.  3. Depression:  I will continue with his home  medication as necessary.  4. End-stage renal disease:  We will get nephrology to see patient and      continue with his care.   Further treatment will depend on the patient's response to these initial  measures.      Lonia Blood, M.D.  Electronically Signed     LG/MEDQ  D:  05/27/2008  T:  05/27/2008  Job:  [id number]

## 2010-07-27 NOTE — Op Note (Signed)
NAME:  Casey [name], Casey [name]            ACCOUNT NO.:  [id number]   MEDICAL RECORD NO.:  [id number]          PATIENT TYPE:  AMB   LOCATION:  SDS                          FACILITY:  [facility]   PHYSICIAN:  Dandra E. Fields, MD  DATE OF BIRTH:  Aug 02, 1931   DATE OF PROCEDURE:  11/12/2007  DATE OF DISCHARGE:  11/12/2007                               OPERATIVE REPORT   PROCEDURE:  1. Exploration of cephalic vein of left wrist.  2. Placement of left brachiocephalic arteriovenous fistula.   PREOPERATIVE DIAGNOSIS:  End-stage renal disease.   POSTOPERATIVE DIAGNOSIS:  End-stage renal disease.   ANESTHESIA:  Local with IV sedation.   ASSISTANT:  Jerold Coombe, PA-C   OPERATIVE FINDINGS:  1. Small left cephalic vein at wrist level, unusable for fistula.  2. A 3-4 mm cephalic vein at the antecubital level.   OPERATIVE DETAILS:  After obtaining informed consent, the patient was  taken to the operating room.  The patient was placed in supine position  on the operating table.  After adequate sedation, the patient's entire  left upper extremity prepped and draped in the usual sterile fashion.  Local anesthesia was infiltrated midway between the area of the cephalic  vein and radial artery at the wrist.  A longitudinal incision was made  in this location and carried on through subcutaneous tissues.  The area  on the more lateral aspect of the arm was thoroughly explored and the  cephalic vein was located, but it was very small in character.  It did  seem to expand a little bit as we proceeded to perform, but I decided  that the cephalic vein would not be usable at this level.  At this  point, attempts to use the cephalic vein at the wrist level were  abandoned.  Subcutaneous tissues were reapproximated using running 3-0  Vicryl suture.  Skin was closed with 4-0 Vicryl subcuticular stitch.  Attention was then turned to the left antecubital region.  Local  anesthesia was infiltrated in this  location.  A transverse incision was  made in this location and carried down through subcutaneous tissues down  to the level of cephalic vein.  Small side branches were ligated and  divided between silk ties.  Cephalic vein was of better quality in this  location between 3.5-4 mm in diameter.  The vein was dissected free  circumferentially.  The brachial artery was then dissected free in the  medial portion of incision.  Vessel loops were placed proximal and  distal to planned site of arteriotomy.  Brachial artery was  approximately 3 mm in diameter.  The patient was then given 5000 units  of intravenous heparin.  Distal cephalic vein was ligated with 2-0 silk  tie and transected.  Vein was gently distended with heparinized saline  and swung over to the level of the artery.  Vessel loops were used to  control the artery proximally and distally.  A longitudinal opening was  made in the brachial artery and vein was then sewn end of vein to side  of artery using running 7-0 Prolene suture.  Just prior  to completion of  anastomosis, there was fore bled, back bled, and thoroughly flushed.  Anastomosis was secured.  Vessel loops released.  There was palpable  thrill in proximal fistula immediately.  Next, hemostasis was obtained.  Subcutaneous tissues were reapproximated using running 3-0 Vicryl  suture.  The skin was closed with 4-0  Vicryl subcuticular stitch.  The patient tolerated the procedure well,  and there were no complications.  Instrument, sponge, and needle counts  were correct at the end of the case.  The patient was taken to recovery  room in stable condition.      Janetta Hora. Fields, MD  Electronically Signed     CEF/MEDQ  D:  11/12/2007  T:  11/13/2007  Job:  [id number]

## 2010-07-27 NOTE — Assessment & Plan Note (Signed)
OFFICE VISIT   [name], [name]  DOB:  [birth year]                                       01/16/2008  ZOXWR#:[id number]   The patient returns for follow-up today after placement of a left  brachiocephalic fistula August 31st.  He was last seen on October 7th.  On exam today, blood pressure is 164/80, heart rate 67 and regular, he  has an easily palpable thrill over the left arm AV fistula.  It feels  like it has matured at this point.  I believe it is okay to cannulate at  this time.  He will follow up on an as-needed basis.  I have encouraged  him to continue exercising the fistula.   Janetta Hora. Fields, MD  Electronically Signed   CEF/MEDQ  D:  01/16/2008  T:  01/17/2008  Job:  [id number]

## 2010-07-27 NOTE — Assessment & Plan Note (Signed)
OFFICE VISIT   Elders, Parthiv H  DOB:  [birth year]                                       10/22/2007  ZOXWR#:[id number]   REASON FOR VISIT:  Dialysis access.   HISTORY:  This is a 77 year old gentleman with end-stage renal disease  who has been dialyzing via peritoneal dialysis which is now infected.  His renal disease is due to hypertension.  He comes in today for  discussion of dialysis access.  The patient is contemplating whether or  not he wishes to continue dialysis at all.  He is dialyzing through a  right-sided catheter.   REVIEW OF SYSTEMS:  GENERAL:  Positive for weight loss.  CARDIAC:  Shortness of breath when lying flat, shortness of breath with  exertion.  PULMONARY:  Negative.  GI:  Positive for reflux, hiatal hernia, trouble swallowing, abdominal  pain and constipation.  GU:  Positive for kidney disease.  VASCULAR:  Negative.  NEURO:  Negative.  ORTHO:  Negative.  PSYCH:  Positive for depression.  ENT:  Positive for recent changes in eyesight and hearing.  HEME:  Negative.   PAST MEDICAL HISTORY:  Hypertension and depression.   FAMILY HISTORY:  Negative for cardiovascular disease.   SOCIAL HISTORY:  He is married with 3 children.  Does not smoke.  Has  never smoked.  He is retired.   MEDICATIONS:  Please see medical record.   ALLERGIES:  None.   PHYSICAL EXAMINATION:  Blood pressure 148/78, pulse 84.  General:  He is  well-appearing, in no acute distress.  HEENT:  Normocephalic,  atraumatic.  Pupils equal.  Sclerae anicteric.  Neck:  Supple, no JVD.  Cardiovascular:  Regular rate and rhythm.  Pulmonary:  Lungs are clear  bilaterally.  Abdomen:  Soft, nontender.  Extremities:  Warm and well  perfused.  Left arm reveals a palpable radial pulse.   DIAGNOSTIC STUDIES:  The patient had vein mapping today which shows him  to have a good cephalic vein beginning at the level of the wrist.   ASSESSMENT/PLAN:  End-stage renal  disease, in need of permanent access.  Patient came in for a left-sided Cimino wrist fistula.  The risks and  benefits of this were discussed with patient.  I discussed an 85% chance  of maturity as well as risk of steal syndrome.  The patient is  contemplating whether or not to continue dialysis and does not want to  schedule this at this time.  He told me that he will contact our office  for scheduling.   Jorge Ny, MD  Electronically Signed   VWB/MEDQ  D:  10/22/2007  T:  10/23/2007  Job:  [id number]   cc:   [name], M.D.

## 2010-07-27 NOTE — Procedures (Signed)
CEPHALIC VEIN MAPPING   INDICATION:  End-stage renal disease.   HISTORY:  End-stage renal disease.   EXAM:  Left cephalic vein duplex.   The right cephalic vein is not evaluated.   The left cephalic vein is compressible.   Diameter measurements range from 0.28 to 0.38 cm.   See attached worksheet for all measurements.   IMPRESSION:  Patent left cephalic vein which is of acceptable diameter  for use as a dialysis access site.   ___________________________________________  V. Charlena Cross, MD   MG/MEDQ  D:  10/22/2007  T:  10/22/2007  Job:  [id number]

## 2010-07-27 NOTE — Discharge Summary (Signed)
NAME:  Casey [name], Casey [name] NO.:  [id number]   MEDICAL RECORD NO.:  [id number]          PATIENT TYPE:  INP   LOCATION:  6741                         FACILITY:  [facility]   PHYSICIAN:  Maree Krabbe, M.D.DATE OF BIRTH:  27-Sep-1931   DATE OF ADMISSION:  09/23/2007  DATE OF DISCHARGE:  09/28/2007                               DISCHARGE SUMMARY   ADMITTING DIAGNOSES:  1. Fungal peritonitis.  2. End-stage renal disease, on chronic peritoneal dialysis.  3. Leukocytosis.  4. Hypertension.  5. Secondary hyperparathyroidism.  6. Depression.   DISCHARGE DIAGNOSES:  1. Fungal peritonitis, status post removal of peritoneal dialysis      catheter on September 24, 2007, by Dr. Glenna Fellows.  2. End-stage renal disease, now on chronic hemodialysis.  3. Status post right internal jugular tunneled PermCath,      Interventional Radiology on September 26, 2007.  4. Leukocytosis, improved.  5. Hypertension.  6. Secondary hyperparathyroidism.  7. Iron deficiency anemia.  8. Depression.   BRIEF HISTORY:  A 77 year old white male with end-stage renal disease  secondary to hypertension, on CCPD for the last 4 years and has had no  peritonitis for the last 3-1/2 years until approximately February 2009,  when he had methicillin-sensitive Staph coagulase negative.  In April  2009, he had methicillin-resistant Staph coagulase negative.  In June  2009, again he was treated off and on for peritonitis and 2 days ago  noticed cloudy fluid with some abdominal pain.  As an outpatient, the PD  fluid was sent for culture and has been reported as yeast.  Oral  Diflucan was started on 150 mg daily on Friday, September 21, 2007.   The patient presented to the emergency room with abdominal pain, severe  and progressive fatigue, anorexia and intermittent nausea and vomiting.  Blood work shows a white count of 30,000, peripheral and he is being  admitted for IV antibiotics.   LABORATORY DATA ON ADMISSION:   White count 30,000, hemoglobin 11, and  platelets 373,000.  Sodium 136, potassium 3.9, chloride 96, CO2 28, BUN  44, creatinine 5.6, albumin 1.9, calcium 8.9, and AST is 34.  Abdominal  x-ray shows nonspecific bowel gas pattern.   HOSPITAL COURSE:  The patient was admitted and placed on intravenous  vancomycin, Fortaz, and Diflucan.  On September 24, 2007, he underwent  removal of PD catheter, which he tolerated well.  The PD cell count from  September 24, 2007, showed 570 white cells of which 86% were segs, 14% were  eosinophils, 0 lymphocytes, and 0 monocytes.  PD fluid grew out yeast.  Blood cultures remained negative.  The patient improved every day after  catheter removal.  White count has dropped to a low of 15,000 at the  time of discharge.  He has remained afebrile.  His abdominal pain has  resolved and his appetite is improving.  The patient will continue to  receive empiric Fortaz and vancomycin for a total of [redacted] weeks along with  oral Diflucan at 200 mg daily for a 3-week course.   On September 26, 2007, the patient had a right tunneled  PermCath placed by  Interventional Radiology.  Hemodialysis was initiated on September 26, 2007,  and he dialyzed again on September 28, 2007.  Outpatient dialysis was  arranged at the [facility] every Tuesday, Thursday,  and Saturday second shift.  Medications were adjusted accordingly with  oral calcitriol being switched to IV Hectorol every dialysis and  atenolol was stopped.  His dry weight is estimated at 83 kg as an  outpatient with a blood pressure of approximately 137/70. The patient  was instructed to save his left arm and avoid venipuncture in case he  needs a future Gore-Tex graft or fistula placed.   DISCHARGE MEDICATIONS:  1. Wellbutrin 150 mg daily.  2. Carvedilol 25 mg b.i.d.  3. Nephro-Vite vitamin 1 daily.  4. Diflucan 200 mg every evening for 14 more days.  5. Hectorol 0.5 mcg IV each dialysis.  6. EPO 20,000 units IV each  dialysis.  7. Venofer 100 mg intravenous weekly in dialysis.  8. Vancomycin 1 g IV each dialysis x6 more doses.  9. Fortaz 2 g IV each dialysis x6 more doses through October 16, 2007.   Estimated dry weight 83 kg.  Dialysis every Tuesday, Thursday, and  Saturday at 11:45 a.m. at the [facility].   Discharge instructions, prescriptions, and education took 40 minutes.      Zenovia Jordan, P.A.      Maree Krabbe, M.D.  Electronically Signed    RRK/MEDQ  D:  09/28/2007  T:  09/29/2007  Job:  [id number]

## 2010-07-27 NOTE — Assessment & Plan Note (Signed)
OFFICE VISIT   Casey [name], Casey [name]  DOB:  1932-02-25                                       11/28/2007  ZOXWR#:[id number]   The patient presents to the office today complaining of some swelling in  his left neck and difficulty swallowing.  He was apparently placed on  Augmentin by Dr. Arrie Aran a couple of days ago.  He had a fever of 101  five days ago but has not been febrile since then.  He states that  initially both sides of his neck hurt.  He originally was having  bilateral neck pain.  This is now primarily left unilateral neck pain.  He also has some dysphagia.   On oral exam he has no significant pharyngeal edema or erythema.  On  external neck exam he has slightly increased in size submandibular lymph  nodes bilaterally, left greater than right.  These are nontender.  They  are approximately 3 cm in diameter on the left, 1.5 cm in diameter on  the right.  There is no external erythema on the skin.  He has a left-  sided Diatek catheter.  This is clean and is well below the area of  irritation in his throat.  Of note, he has a left brachiocephalic AV  fistula which was placed a few weeks ago and this is healing well and  has an easily palpable thrill, audible bruit and the vein is dilating  nicely.   I believe the best option for the patient is continue his Augmentin  antibiotic prescribed by Dr. Arrie Aran for at least 2 weeks to see if  this resolves.  He does have some cervical lymphadenopathy and I am not  sure whether this represents viral or bacterial infection but should  resolve in a few weeks with antibiotics.  If not we should give  consideration to further investigation of his cervical lymph nodes.  He  will return for followup with me in 2 weeks' time to reassess his  fistula and to see if his cervical adenopathy has resolved.   Janetta Hora. Fields, MD  Electronically Signed   CEF/MEDQ  D:  11/28/2007  T:  11/29/2007  Job:  [id number]   cc:   Terrial Rhodes, M.D.

## 2010-07-30 NOTE — Discharge Summary (Signed)
NAME:  Casey [name], Casey [name]                      ACCOUNT NO.:  [id number]   MEDICAL RECORD NO.:  [id number]                   PATIENT TYPE:  OIB   LOCATION:  5523                                 FACILITY:  [facility]   PHYSICIAN:  Dyke Maes, M.D.          DATE OF BIRTH:  04/04/1931   DATE OF ADMISSION:  06/13/2003  DATE OF DISCHARGE:  06/18/2003                                 DISCHARGE SUMMARY   DISCHARGE DIAGNOSES:  1. Nausea and vomiting.  2. Fevers, leukocytosis.  3. Hypertension.  4. Anemia.  5. End-stage renal disease starting hemodialysis on this hospital stay while     peritoneal dialysis catheter heals.   DISCHARGE MEDICATIONS:  1. Protonix 40 mg q.d.  2. Nephro-Vite one tablet q.d.  3. Zoloft 100 mg q.d.  4. Tricor 145 mg q.d.  5. Added lisinopril 10 mg q.d.  6. Added epoetin 5,000 units with hemodialysis.  7. Added Plendil 10 mg q.d.  8. STOPPED labetalol, minoxidil, and torsemide.   HEMODIALYSIS ORDERS:  Opti 200 x3.5 hours. BFR 300 on Thursday, 350 next  treatment, and 400 thereafter. DFR 800. Estimated dry weight 81 kg.  Potassium 3.7.  Heparin standard.   FOLLOWUP APPOINTMENT:  The patient will follow up at [facility] for hemodialysis on Thursday at 11 a.m. He will be on Tuesday,  Thursday, Saturday schedule. He will also receive home training on Friday of  this week April 8 at 10 a.m. or 3 p.m.   PROCEDURE:  1. CAPD catheter placement per Dr. Zachery Dakins on June 13, 2003; see operative     note.  2. Right IJ Diatek catheter placed via ultrasound by Dr. Fabienne Bruns on     June 15, 2003; see operative note.   BRIEF HISTORY:  Mr. Wampole is a 77 year old white male with chronic renal  insufficiency, hypertension who presents for placement of a CAPD catheter by  Dr. Zachery Dakins. The patient states that he has been  having uremic symptoms  for several weeks including nausea, vomiting, loss of appetite, lethargy,  fatigue, dyspnea on  exertion, and lower abdominal pain.   HOSPITAL COURSE:  1. Nausea and vomiting. The patient's nausea and vomiting was believed to be     secondary to uremia. For this reason, we decided to access the peritoneal     dialysis catheter while in hospital. Initially, we were going to try     supine peritoneal dialysis at a lower rate, but because patient's     symptoms continued despite being on peritoneal dialysis, it was decided     that we would do hemodialysis starting here in the hospital and that he     would continue this until his peritoneal dialysis site healed. The other     issue with the patient's nausea and vomiting is at one point he was on     labetalol when he came in. He said that he noticed some of  his symptoms     worsened when he was taking his labetalol tablets. For this reason, we     discontinued his labetalol here in the hospital and replaced with an ACE     inhibitor. Because he was on minoxidil and his beta blocker was stopped,     we decided to stop his minoxidil as well to prevent tachycardia off the     concomitant beta blocker.  2. Fevers. The patient did have one temperature of 100.8 while in the     hospital one day after dialysis. We did keep him here to watch his fever     trend the next day without antibiotics, and his fevers resolved     spontaneously. We did check CBC, primarily white count, on the day of his     fevers, and it went from 8.8 to 12.6 from April 4 to April 5; however, on     April 6, his white count had come back down to 10.0. The patient and his     wife were informed that if he does develop fevers at home to give     dialysis a call.  3. Hypertension. The patient's blood pressure was fairly high when he came     in, even after dialysis was started. He was on Demadex, labetalol,     minoxidil. We stopped the labetalol as noted above due to nausea and then     replaced with an ACE inhibitor. Because the minoxidil was now being used      without labetalol, we stopped the minoxidil and instead used a calcium-     channel blocker, Plendil, which his insurance covers. His blood pressure     at discharge was about 130 to 159 over 67 to 90. We also of note stopped     his Demadex while in the hospital.  4. Anemia. The patient's hemoglobin when he came in was marginal. His     hemoglobin was noted to be 12. It dropped a little bit while he was in     the hospital. We did place him on EPO with hemodialysis 55,000 units/kg.     The patient will also be on iron supplementation.  5. End-stage renal disease. The patient as noted had symptoms of uremia and     for this reason was started on hemodialysis. His symptoms improved     quickly after being placed on dialysis, and at discharge, the patient     says he feels better now than he has felt in a long time. The plan is to     continue him on hemodialysis until his peritoneal dialysis site is healed     at which time he will be switched back to peritoneal dialysis so that he     can use it at own. The patient is not excited about being on dialysis,     and we had a long conversion with him and his family. At this time, Mr.     Overall is agreeable to getting the dialysis as planned.   DISCHARGE LABORATORY DATA:  CBC:  White count 10, hemoglobin 11.5, platelets  207. Sodium 137, potassium 3.3, chloride 101, CO2 27, glucose 122, BUN 22,  creatinine 4.3, calcium 9.1. Hepatitis B surface antigen was negative.  Phosphorous level on April 1 was 3.8.      Eliseo Gum, M.D.  Dyke Maes, M.D.    KC/MEDQ  D:  06/18/2003  T:  06/19/2003  Job:  [id number]   cc:   Thelma Barge, M.D.   [facility]

## 2010-07-30 NOTE — Assessment & Plan Note (Signed)
[facility]                           GASTROENTEROLOGY OFFICE NOTE   NAME:[name], [name]                   MRN:          [id number]  DATE:12/21/2005                            DOB:          22-Oct-1931    CONSULTANT:  Iva Boop, M.D., [facility]   REQUESTING PHYSICIAN:  [name], M.D.   CHIEF COMPLAINT:  Recurrent dysphagia, abdominal pain and regurgitation.   ASSESSMENT:  This is a 77 year old white man that I saw last year for  dysphagia and he improved after an esophageal dilation.  He is better, but  still has some dysphagia problems returning; and, has been regurgitating and  having upper abdominal pain of unclear etiology.  He also has some change in  his bowels where he is going more frequently and straining somewhat.  He had  a normal colonoscopy last year.   RECOMMENDATIONS AND PLAN:  1. Schedule upper GI endoscopy to look at the esophagus and possibly      dilate again, and rule out obstruction or other mucosal damage.  2. Consider changing his omeprazole dose.  3. Consider a gastric emptying study as gastroparesis could be part of the      problem.  4. Further plans pending this evaluation.   HISTORY:  This is a 77 year old white man with problems as outlined above.  These have been coming back for several months.  His upper abdomen sort of  hurts all the time.  His peritoneal dialysis is stable.  There is no change  in the amount of fluid.  He has had no cell count or peritonitis issues.  Sometime he will cough hard and gag, and vomit or regurgitate.  He can eat  swallow okay much of the time, but there is some solid food dysphagia, but  not nearly as bad as before the dilation last year.  He has some heartburn  at times.  Sometimes after he regurgitates and spits up food he can let  things settle down and eat again.  He has been moving his bowels after every  meal and in the morning, which is a bit of a change, though  they are formed,  but there is no bleeding.  He feels some swollen hemorrhoids.   REVIEW OF SYSTEMS:  CONSTITUTIONAL, CARDIOVASCULAR and RESPIRATORY:  Negative.  See my medical history form.  He has been a little dizzy today  having had some adjustment in his blood pressure medicines.   PAST MEDICAL HISTORY:  1. End-stage renal disease on peritoneal dialysis.  2. History of depression.  3. Hypertension.  4. Hemorrhoid surgery.  5. Tenckhoff catheter placement.  6. Nephrolithiasis.   FAMILY HISTORY:  Unchanged.   SOCIAL HISTORY:  Unchanged.   MEDICATIONS:  1. Atenolol 100 mg daily.  2. Omeprazole 20 mg daily.  3. Gemfibrozil 600 mg daily.  4. Nephro-Vite daily.  5. Micardis 40 mg daily.  6. Calcitriol 25 mcg three times a week.  7. Iron 65 mg daily.  8. Sertraline 150 mg daily.  9. Potassium chloride daily.  10.Zyrtec 10 mg daily.   ALLERGIES:  Drug allergies; none  known.   PHYSICAL EXAMINATION:  GENERAL APPEARANCE:  Physical exam reveals an elderly  white man in no acute distress.  VITAL SIGNS:  Weight 202 pounds, pulse 64 and blood pressure 98/56.  HEENT:  Eyes anicteric.  NECK:  The neck is supple.  CHEST:  The chest is clear.  HEART:  The heart shows normal S1 and S2 with no gallops.  ABDOMEN:  The abdomen is soft.  There is a peritoneal dialysate fluid in  place.  Nontender.  There is no organomegaly or mass appreciated.  EXTREMITIES:  The extremities show no edema.  SKIN:  The skin is warm and dry.  Slightly pale.   NOTE:  The patient had a colonoscopy last year that was normal.  He did have  an over sedation issue and had to be reversed.   I appreciate the opportunity to care fracture this patient.       Iva Boop, MD,FACG     CEG/MedQ  DD:  12/21/2005  DT:  12/23/2005  Job #:  [id number]   cc:   [name], M.D.

## 2010-07-30 NOTE — Assessment & Plan Note (Signed)
[facility]                           GASTROENTEROLOGY OFFICE NOTE   NAME:Casey [name]                   MRN:          [id number]  DATE:01/25/2006                            DOB:          11-18-1931    CHIEF COMPLAINT:  Followup of dysphagia.   Casey [name] had an upper endoscopy to investigate epigastric pain,  regurgitation and dysphagia on December 29, 2005. He had a tortuous esophagus  with distal esophageal stenosis that I dilated with a 52-French Maloney  dilator. At first he said things were really not much better, no improvement  in the dysphagia but he says it is mild and intermittent. His regurgitation  is better. Now he has had some problems with liquids where last year he had  problems with solids. His barium swallow last year showed tertiary  contractions and hang-up of a tablet at the gastroesophageal junction. It  was a small hiatal hernia.   PAST MEDICAL HISTORY:  Reviewed and unchanged.   Weight 208 pounds, pulse 64, blood pressure 118/70.   ASSESSMENT:  Dysphagia problems:  I think this is in part motility  disturbance. His epigastric pain is better. There has really been no  medication change other than some Zyrtec and nasal spray that was added.  (Medications listed and reviewed in the chart.) He feels like sinus drainage  leads to abdominal problems i.e. the epigastric pain was probably related to  post nasal drip and ingestion of some of that he thinks.   While he is better, he still has persistent dysphagia problems. Now more  liquid. I am concerned about a motility disturbance like achalasia. I talked  to him about this. His symptoms are not bothering him that much and he does  not wish to pursue further workup at this time. I think the next step would  include an esophageal manometry plus/minus a repeat barium swallow. If he  had achalasia there are other treatments available that could help. I did  explain that an  earlier diagnosis could prevent a more progressive course of  the disease.   I will see him back as needed at this point. He understands the plan.     Iva Boop, MD,FACG  Electronically Signed    CEG/MedQ  DD: 01/25/2006  DT: 01/25/2006  Job #: [id number]   cc:   [name], M.D.

## 2010-07-30 NOTE — Op Note (Signed)
NAME:  Casey [name], Casey [name]                      ACCOUNT NO.:  [id number]   MEDICAL RECORD NO.:  [id number]                   PATIENT TYPE:  OIB   LOCATION:  5523                                 FACILITY:  [facility]   PHYSICIAN:  Janetta Hora. Fields, MD               DATE OF BIRTH:  [birth year]   DATE OF PROCEDURE:  06/15/2003  DATE OF DISCHARGE:                                 OPERATIVE REPORT   PROCEDURE:  1. Insertion of right internal jugular vein Diatek catheter.  2. Right neck ultrasound.   PREOPERATIVE DIAGNOSIS:  End-stage renal disease.   POSTOPERATIVE DIAGNOSIS:  End-stage renal disease.   SURGEON:  Aedin Jeansonne. Fields, MD   ANESTHESIA:  Local with IV sedation.   INDICATIONS:  The patient is a 77 year old male who recently had a  peritoneal dialysis catheter placed.  The catheter is currently not healed  sufficiently to allow for peritoneal dialysis.  He will need interim  hemodialysis access as a bridge to peritoneal dialysis.   OPERATIVE FINDINGS:  1. Tortuous right common carotid artery.  2. Patent right internal jugular vein.  3. A 19 cm Diatek catheter.   OPERATIVE DETAILS:  After obtaining informed consent, the patient was taken  to the operating room.  The patient was placed in the supine position on the  operating room table.   Next a __________ ultrasound was used to localize the right internal jugular  vein.  The common carotid artery was noted to be quite tortuous and  prominent.  The internal jugular vein was widely patent with normal  respiratory variation compressibility.   Next, the patient's entire neck and chest were prepped and draped in the  usual sterile fashion.  Local anesthesia was infiltrated over the right  internal jugular vein.  A small finder needle was used to localize the right  internal jugular vein.  A large introducer needle was then used to cannulate  the right internal jugular vein.  A 0.035 J-tipped guidewire was then  threaded through  the needle down into the inferior vena cava under  fluoroscopic guidance.  Next, sequential 12, 14, and 16 French dilators were  placed over the guidewire into the right atrium.  Peel-away sheath was  placed into the right atrium and a 19 cm Diatek catheter was threaded  through the peel-away sheath into the right atrium.  The peel-away sheath  was removed and the tip of the catheter was noted to be in the right atrium  under fluoroscopy.   Next, the catheter was tunneled out subcutaneously over the right clavicular  area.  The catheter was cut to length and the hub attached. The catheter was  noted to flush internally easily.  The catheter was then sutured to the skin  and a neck insertion site close to the Vicryl stitch.  The catheter was then  loaded with concentrated heparin solution.  The catheter was then inspected  under fluoroscopy  and found one of its tips to be in the right atrium.  No  kinks throughout its course.   The patient tolerated the procedure well and there were no other  complications.  Instrument, sponge, and needle counts were correct at the  end of the case.  The patient was taken to the recovery room in stable  condition.                                               Janetta Hora. Fields, MD    CEF/MEDQ  D:  06/15/2003  T:  06/15/2003  Job:  [id number]

## 2010-07-30 NOTE — H&P (Signed)
NAME:  CARDEN, TEEL                      ACCOUNT NO.:  [id number]   MEDICAL RECORD NO.:  [id number]                   PATIENT TYPE:  OIB   LOCATION:  5523                                 FACILITY:  [facility]   PHYSICIAN:  Dyke Maes, M.D.          DATE OF BIRTH:  09/11/1931   DATE OF ADMISSION:  06/13/2003  DATE OF DISCHARGE:  06/18/2003                                HISTORY & PHYSICAL   HISTORY OF PRESENT ILLNESS:  Mr. Bohnet is a 77 year old white male with  chronic renal insufficiency (creatinine 4.6 on May 16, 2003) and  hypertension. Presents for surgical placement of PD catheter by Dr.  Zachery Dakins. The patient complains of severe months of increasing fatigue,  listlessness, loss of appetite, nausea and vomiting, a metallic taste in  mouth, dyspnea on exertion, and lower abdominal pain. He denies any gross  hematuria, chest pain, or palpitations.   REVIEW OF SYSTEMS:  The patient denies any fevers, chills, weight loss,  vision changes, hearing changes, mouth ulcers, wheezing, chest pain,  palpitations, hematochezia, melena, myalgias, arthralgias, headaches,  numbness or weakness in extremities, or skin lesions.   PAST MEDICAL HISTORY:  1. Hypertension for 25 years.  2. Chronic renal insufficiency, creatinine 4.6 on May 16, 2003.     A. GFR less than 20 cc a minute.     B. Renal ultrasound showing small size of 10.1-cm kidneys on March 28, 2003.     C. MRA on April 28, 2003 showed 60% right RAS and 40% left RAS.  3. Renal vascular disease.  4. Hyperlipidemia.  5. Secondary hyperparathyroidism.  6. Anemia, normocytic.  7. GERD.  8. History of nephrolithiasis 25 years ago.  9. History of hemorrhoidectomy x2 20 to 25 years ago.  10.      History of vasectomy 30 years ago.   HOME MEDICATIONS:  1. Aciphex 20 mg q.d.  2. Zoloft 100 mg q.d.  3. Zocor 160 mg q.d.  4. Hydrocodone 5/500 p.r.n.  5. Nephro-Vite q.d.  6. Torsemide 50 mg q.d.  7.  Minoxidil 2.5 mg b.i.d.  8. Labetalol 100 mg two tablets b.i.d.   ALLERGIES:  The patient told not to take aspirin secondary to his kidneys.   SOCIAL HISTORY:  The patient lives in [place] with his wife. He has three  children ages 59, 20, and 38, all of which are healthy. He is retired from  [employer] since 1987 after about 30 years. 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 Right now, he says that he is  significantly declined in his hobbies. He denies any tobacco, alcohol, or  drug use.   FAMILY HISTORY:  His mother died at age 32 from complications of a CVA and  hypertension. His father died at age 75 of a CVA. He has one brother and two  sisters both of  which are healthy that he knows of.   PHYSICAL EXAMINATION:  VITAL SIGNS:  Temperature 97.1, blood pressure  169/105, respiratory rate 15, O2 saturations 100% on 2 liters, and the  patient is receiving D5 half normal saline at 10 cc an hour.  GENERAL:  Pleasant, comfortable, moderately obese, no acute distress, alert  and oriented x4.  HEENT:  Pupils are equal, round, and reactive to light. Sclerae is white.  Oropharynx is clear.  NECK:  Supple. Question of carotid bruits, right greater than left.  HEART:  Regular rate and rhythm without murmurs, rubs, or gallops.  LUNGS:  Clear to auscultation bilaterally. No wheezes or crackles  appreciated.  BACK:  No CVA tenderness.  ABDOMEN:  Soft, fresh bandages with catheter in place, nondistended,  nontender, hyper active bowel sounds.  EXTREMITIES:  No lower extremity edema, 2+ peripheral pulses.   LABORATORY DATA:  Labs from June 11, 2003:  Sodium 139, potassium 4.4,  chloride 104, bicarb 28, BUN 55, creatinine 4.7, and glucose 117. Total  bilirubin 0.7, AP 38, AST 23, ALT 16, total protein 6.9, albumin 3.8,  calcium 9.7, anion gap 7. White count 8.2, hemoglobin 12, MCV 88, platelets  247, RDW 13.6. Chest x-ray on June 11, 2003 was normal.    ASSESSMENT:  A 77 year old white male with end-stage renal disease and  hypertension, here for CAPD catheter placement.   PLAN:  1. End-stage renal disease. Peritoneal catheter was placed without     complications by Dr. Zachery Dakins. The patient has not received any recent     abdominal surgeries, ileus, or infections. Given the patient's symptoms     of uremia, it was decided that we would start supine peritoneal dialysis     and that he would receive home training as well. We will need to watch     for signs of peritonitis, monitor his BMF for hyperglycemia. We will also     need to add heparin to his peritoneal dialysate.  2. Anemia. The patient is currently stable. No need for EPO at this point.     Will monitor hemoglobin closely.  3. Hypertension. Continue labetalol until peritoneal dialysis and will also     continue other medications during dialysis including torsemide and     minoxidil.  4. Renal vascular disease. Monitor for now. Follow as an outpatient. The     patient sees Dr. Madilyn Fireman in outpatient setting.  5. Secondary hyperparathyroidism. No vitamin D analogs at this time. Will     treat if indicated.  6. Hyperlipidemia. We will continue Tricor 160 mg q.d. at this time.      Eliseo Gum, M.D.                      Dyke Maes, M.D.    KC/MEDQ  D:  06/18/2003  T:  06/19/2003  Job:  [id number]   cc:   Thelma Barge, M.D.  [facility]

## 2010-07-30 NOTE — Op Note (Signed)
NAME:  Casey [name], Casey [name]                      ACCOUNT NO.:  [id number]   MEDICAL RECORD NO.:  [id number]                   PATIENT TYPE:  OIB   LOCATION:  2550                                 FACILITY:  [facility]   PHYSICIAN:  Anselm Pancoast. Zachery Dakins, M.D.          DATE OF BIRTH:  Mar 28, 1931   DATE OF PROCEDURE:  06/13/2003  DATE OF DISCHARGE:                                 OPERATIVE REPORT   PREOPERATIVE DIAGNOSIS:  Chronic renal failure, desires chronic ambulatory  peritoneal dialysis.   OPERATION:  Placement of chronic ambulatory peritoneal dialysis catheter,  [place].   General anesthesia, attempted to do a local and sedation.   SURGEON:  Anselm Pancoast. Zachery Dakins, M.D.   ASSISTANT:  Nurse.   HISTORY:  Deangleo Passage is a 77 year old Caucasian male who was referred  to me for consideration of hemodialysis.  The patient's symptoms are  primarily nausea postprandially, and Dr. Hyman Hopes is his physician, and his BUN  and creatinine are significantly elevated but not as high as the usual  symptoms that the patient is encountering.  I questioned whether this could  possibly be a gallbladder problem and did do an ultrasound of the  gallbladder that showed no evidence of any stones or abnormalities.  The  patient has been seen by home training and counseling as far as on CAPD  versus hemodialysis and after thorough discussions and etc., has elected to  try peritoneal dialysis.  He was significantly nauseated last night, but his  abdomen is not tender today.  His white count is normal, and his potassium  this morning is 4.4.  He was given a gram of Kefzol immediately  preoperatively and taken back to the operative suite.  We discussed about  trying to do it with sedation, but he may need general anesthesia.   The abdomen was first clipped, then prepped with Betadine surgical scrub and  solution and draped in a sterile manner, and he had been moderately sedated.  The skin was injected  with Marcaine with adrenalin and Xylocaine mixture and  the anterior rectus area was open down approximately an inch and a half of  adipose tissue, exposing the anterior rectus fascia.  This was then  anesthetized and a small vertical incision was made.  The underlying rectus  muscle was opened in directions of its fibers, exposing the posterior rectus  fascia and peritoneum.  I then anesthetized this with a few milliliters of  the Xylocaine and picked the two areas up between hemostats and then kind of  carefully opening through the peritoneal cavity.  His posterior rectus  fascia is nearly two layers, a little thinner than usual for a male, and I  grasped the edges and then did two pursestring sutures of 2-0 Vicryl.  He  was kind of half asleep and half awake and about this time had a significant  coughing episode with spasms and he obviously is a snorer, and we stopped,  tried to regroup, then positioned the catheter, but we could never really  get the catheter to lie comfortably so the sutures could be tied, and I  replaced the sutures, tried again, and after about three attempts with  sedation thought that it would be best just to go ahead and put him asleep  since he has these kinds of paroxysmal coughing episodes.  He has a history  of reflux and the anesthesiologist did not think it would be safe to just do  a mask, so we went ahead and intubated him for a short while, and with this  the sutures could be tied, the Silastic ball was in the peritoneal cavity,  and the cuff is lying on the posterior rectus fascia.  Next the rectus  muscle was approximated with 3-0 chromic and then the anterior rectus fascia  was closed with interrupted 2-0 Prolene.  Next the catheter was tunneled  subcutaneously and exit in the right lower quadrant.  It flushes easily,  returns clear, and it will flow in by gravity.  The subcutaneous tissue was  closed with 3-0 chromic and then the skin closed with  interrupted 4-0 nylon.  The patient tolerated the procedure nicely and was extubated prior to  leaving the recovery room, and we will flush him this afternoon.  He has not  had his blood pressure medication and his pressure  was slightly elevated, and we will resume everything in the recovery room.  The patient will be flushed and be discharged in the morning and hopefully  could start peritoneal dialysis in approximately two or three weeks  according to his symptoms.                                               Anselm Pancoast. Zachery Dakins, M.D.    WJW/MEDQ  D:  06/13/2003  T:  06/13/2003  Job:  [id number]   cc:   Peritoneal Dialysis Home Training   [name], M.D.  [address]  [place]  [place] [address]  Fax: [phone]

## 2010-08-02 ENCOUNTER — Telehealth: Payer: Self-pay | Admitting: Cardiology

## 2010-08-02 NOTE — Telephone Encounter (Signed)
Pravastatin 40 mg once a day to be call in to [address] # [phone]. Pt states meds needs prior auth.

## 2010-08-03 NOTE — Telephone Encounter (Signed)
Per pharmacist at [facility]--Pravastatin does not require prior auth-just need OK to refill. I gave pharmacist OK for 3 refills. (Pt due to see Dr Shirlee Latch in July 2012). Pt taking Pravastatin 40mg  daily.

## 2010-08-05 LAB — PROTIME-INR: INR: 2.4 — AB (ref <=1.1)

## 2010-08-10 ENCOUNTER — Ambulatory Visit (INDEPENDENT_AMBULATORY_CARE_PROVIDER_SITE_OTHER): Payer: Self-pay | Admitting: Cardiology

## 2010-08-10 DIAGNOSIS — R0989 Other specified symptoms and signs involving the circulatory and respiratory systems: Secondary | ICD-10-CM

## 2010-08-30 ENCOUNTER — Encounter: Payer: Self-pay | Admitting: Cardiology

## 2010-09-09 ENCOUNTER — Encounter: Payer: Self-pay | Admitting: Cardiology

## 2010-09-10 ENCOUNTER — Ambulatory Visit (INDEPENDENT_AMBULATORY_CARE_PROVIDER_SITE_OTHER): Payer: Medicare Other | Admitting: Cardiology

## 2010-10-07 ENCOUNTER — Ambulatory Visit (INDEPENDENT_AMBULATORY_CARE_PROVIDER_SITE_OTHER): Payer: Self-pay | Admitting: Cardiovascular Disease

## 2010-10-07 DIAGNOSIS — R0989 Other specified symptoms and signs involving the circulatory and respiratory systems: Secondary | ICD-10-CM

## 2010-10-29 ENCOUNTER — Telehealth: Payer: Self-pay | Admitting: Pharmacist

## 2010-10-29 NOTE — Telephone Encounter (Signed)
Would talk to Casey [name] about the bruise again.  Would be fine for him to come by and have nurse or office DOD take a look at his leg on Monday if it is concerning to them or seems extensive.  The bruising ought to be evaluated if they are concerned.  If it is simple bruising, it is not likely to be dangerous and risk of stroke off coumadin would be far more concerning.

## 2010-10-29 NOTE — Telephone Encounter (Signed)
Mrs Madyx Delfin called concerned about a dark bruise on Mr. Yadao's inner-upper thigh. She denied any swelling or warmth to the area and did not recollect him hitting/bumping the area. His most recent INR was ok at 2.1 on 10/05/10 so would be unlikely due to a supra-therapeutic INR. I told Mrs. Glomski that she could contact his PCP to see if they thought he should be evaluated.   Mrs. Yon also stated that Mr. Muller has decided to stop taking his Coumadin and that he will be cancelling further INR checks. I told her that that is something to discuss with his cardiologist before stoppping. She quickly hung up the phone without any further discussion.

## 2010-11-01 NOTE — Telephone Encounter (Signed)
Pt's wife called back to office.  Pt spoke with technicians at dialysis and has agreed to stay on Coumadin.  He is due to have INR checked at dialysis tomorrow.

## 2010-11-04 ENCOUNTER — Ambulatory Visit (INDEPENDENT_AMBULATORY_CARE_PROVIDER_SITE_OTHER): Payer: Self-pay | Admitting: Internal Medicine

## 2010-11-04 DIAGNOSIS — R0989 Other specified symptoms and signs involving the circulatory and respiratory systems: Secondary | ICD-10-CM

## 2010-11-19 ENCOUNTER — Other Ambulatory Visit: Payer: Self-pay | Admitting: Cardiology

## 2010-11-19 DIAGNOSIS — I6529 Occlusion and stenosis of unspecified carotid artery: Secondary | ICD-10-CM

## 2010-11-22 ENCOUNTER — Encounter (INDEPENDENT_AMBULATORY_CARE_PROVIDER_SITE_OTHER): Payer: Medicare Other | Admitting: Cardiology

## 2010-11-22 ENCOUNTER — Ambulatory Visit (INDEPENDENT_AMBULATORY_CARE_PROVIDER_SITE_OTHER): Payer: Self-pay | Admitting: Cardiology

## 2010-11-22 DIAGNOSIS — R0989 Other specified symptoms and signs involving the circulatory and respiratory systems: Secondary | ICD-10-CM

## 2010-11-22 DIAGNOSIS — I6529 Occlusion and stenosis of unspecified carotid artery: Secondary | ICD-10-CM

## 2010-11-26 ENCOUNTER — Emergency Department (HOSPITAL_COMMUNITY)
Admission: EM | Admit: 2010-11-26 | Discharge: 2010-11-26 | Disposition: A | Discharge status: Home / Self Care | Payer: Medicare Other | Attending: Emergency Medicine | Admitting: Emergency Medicine

## 2010-11-26 DIAGNOSIS — K219 Gastro-esophageal reflux disease without esophagitis: Secondary | ICD-10-CM | POA: Insufficient documentation

## 2010-11-26 DIAGNOSIS — Z7901 Long term (current) use of anticoagulants: Secondary | ICD-10-CM | POA: Insufficient documentation

## 2010-11-26 DIAGNOSIS — M129 Arthropathy, unspecified: Secondary | ICD-10-CM | POA: Insufficient documentation

## 2010-11-26 DIAGNOSIS — F329 Major depressive disorder, single episode, unspecified: Secondary | ICD-10-CM | POA: Insufficient documentation

## 2010-11-26 DIAGNOSIS — F3289 Other specified depressive episodes: Secondary | ICD-10-CM | POA: Insufficient documentation

## 2010-11-26 DIAGNOSIS — Z992 Dependence on renal dialysis: Secondary | ICD-10-CM | POA: Insufficient documentation

## 2010-11-26 DIAGNOSIS — M25469 Effusion, unspecified knee: Secondary | ICD-10-CM | POA: Insufficient documentation

## 2010-11-26 DIAGNOSIS — N19 Unspecified kidney failure: Secondary | ICD-10-CM | POA: Insufficient documentation

## 2010-11-26 DIAGNOSIS — Z7982 Long term (current) use of aspirin: Secondary | ICD-10-CM | POA: Insufficient documentation

## 2010-11-26 DIAGNOSIS — Z79899 Other long term (current) drug therapy: Secondary | ICD-10-CM | POA: Insufficient documentation

## 2010-11-26 DIAGNOSIS — M25569 Pain in unspecified knee: Secondary | ICD-10-CM | POA: Insufficient documentation

## 2010-11-29 ENCOUNTER — Telehealth: Payer: Self-pay | Admitting: *Deleted

## 2010-11-29 NOTE — Telephone Encounter (Signed)
Was asked by Omar Person to call patient and or wife to discuss the need for the CT angiogram of the neck.  Jasmine December called to schedule the pt however the wife didn't seem to understand that the testing he needs is different from the carotid doppler he had.  Left message for pt to call back to discuss being scheduled

## 2010-11-30 LAB — PROTIME-INR: INR: 3.5 — AB (ref 0.9–1.1)

## 2010-11-30 NOTE — Telephone Encounter (Signed)
I talked with pt's wife. Pt will schedule CTA of neck to further evaluate carotid arteries.

## 2010-12-02 ENCOUNTER — Ambulatory Visit (INDEPENDENT_AMBULATORY_CARE_PROVIDER_SITE_OTHER): Payer: Self-pay | Admitting: Cardiology

## 2010-12-02 ENCOUNTER — Telehealth: Payer: Self-pay | Admitting: *Deleted

## 2010-12-02 DIAGNOSIS — R0989 Other specified symptoms and signs involving the circulatory and respiratory systems: Secondary | ICD-10-CM

## 2010-12-02 NOTE — Telephone Encounter (Signed)
Omar Person B - cartiod ct More Detail >>      cartiod ct      Pricilla Holm        Sent: Tue November 30, 2010  4:13 PM    To: Jacqlyn Krauss, RN; Wayman Hoard    MRN: [id number] DOB: 21-Mar-1931     Pt Home: [phone]               Message     Per pt wife don't want to sched for now. Will call back at a later date to sched.

## 2010-12-02 NOTE — Telephone Encounter (Signed)
Message copied by Jacqlyn Krauss on Thu Dec 02, 2010 12:32 PM ------      Message from: Pricilla Holm      Created: Tue Nov 30, 2010  4:13 PM      Regarding: cartiod ct       Per pt wife don't want to sched for now. Will call back at a later date to sched.

## 2010-12-03 LAB — URINALYSIS, ROUTINE W REFLEX MICROSCOPIC
Glucose, UA: NEGATIVE
Hgb urine dipstick: NEGATIVE
Ketones, ur: NEGATIVE
Leukocytes, UA: NEGATIVE
pH: 6

## 2010-12-03 LAB — URINE MICROSCOPIC-ADD ON

## 2010-12-03 LAB — COMPREHENSIVE METABOLIC PANEL
AST: 23
Alkaline Phosphatase: 76
BUN: 73 — ABNORMAL HIGH
CO2: 25
Chloride: 96
Creatinine, Ser: 7 — ABNORMAL HIGH
GFR calc Af Amer: 9 — ABNORMAL LOW
GFR calc non Af Amer: 8 — ABNORMAL LOW
Potassium: 4.2
Total Bilirubin: 0.6

## 2010-12-03 LAB — DIFFERENTIAL
Basophils Absolute: 0.2 — ABNORMAL HIGH
Basophils Relative: 1
Eosinophils Absolute: 0.1
Eosinophils Relative: 1
Lymphocytes Relative: 13
Monocytes Absolute: 1.5 — ABNORMAL HIGH

## 2010-12-03 LAB — BODY FLUID CULTURE

## 2010-12-03 LAB — PATHOLOGIST SMEAR REVIEW

## 2010-12-03 LAB — GRAM STAIN

## 2010-12-03 LAB — CBC
HCT: 32.1 — ABNORMAL LOW
MCV: 95.6
RBC: 3.36 — ABNORMAL LOW
WBC: 17.1 — ABNORMAL HIGH

## 2010-12-03 LAB — BODY FLUID CELL COUNT WITH DIFFERENTIAL
Monocyte-Macrophage-Serous Fluid: 24 — ABNORMAL LOW
Total Nucleated Cell Count, Fluid: 9180 — ABNORMAL HIGH

## 2010-12-09 LAB — CBC
HCT: 29.5 — ABNORMAL LOW
HCT: 30.8 — ABNORMAL LOW
Hemoglobin: 10 — ABNORMAL LOW
Hemoglobin: 11.3 — ABNORMAL LOW
MCHC: 33
MCHC: 33.2
MCV: 89.6
MCV: 89.7
MCV: 90.1
Platelets: 337
RBC: 3.81 — ABNORMAL LOW
RDW: 19.1 — ABNORMAL HIGH
RDW: 19.6 — ABNORMAL HIGH

## 2010-12-09 LAB — DIFFERENTIAL
Basophils Absolute: 0.2 — ABNORMAL HIGH
Basophils Relative: 1
Eosinophils Absolute: 0.5
Eosinophils Relative: 1
Eosinophils Relative: 2
Monocytes Relative: 4
Neutrophils Relative %: 88 — ABNORMAL HIGH

## 2010-12-09 LAB — RENAL FUNCTION PANEL
BUN: 54 — ABNORMAL HIGH
BUN: 69 — ABNORMAL HIGH
CO2: 29
Calcium: 8.2 — ABNORMAL LOW
Creatinine, Ser: 6.25 — ABNORMAL HIGH
Glucose, Bld: 110 — ABNORMAL HIGH
Glucose, Bld: 81
Phosphorus: 6.7 — ABNORMAL HIGH
Potassium: 4
Sodium: 132 — ABNORMAL LOW

## 2010-12-09 LAB — BODY FLUID CULTURE

## 2010-12-09 LAB — BODY FLUID CELL COUNT WITH DIFFERENTIAL
Lymphs, Fluid: 0
Monocyte-Macrophage-Serous Fluid: 0 — ABNORMAL LOW

## 2010-12-09 LAB — COMPREHENSIVE METABOLIC PANEL
CO2: 28
Calcium: 8.9
Creatinine, Ser: 5.62 — ABNORMAL HIGH
GFR calc Af Amer: 12 — ABNORMAL LOW
GFR calc non Af Amer: 10 — ABNORMAL LOW
Glucose, Bld: 113 — ABNORMAL HIGH

## 2010-12-09 LAB — CULTURE, BLOOD (ROUTINE X 2)
Culture: NO GROWTH
Culture: NO GROWTH

## 2010-12-09 LAB — PROTIME-INR: Prothrombin Time: 15

## 2010-12-09 LAB — VANCOMYCIN, RANDOM: Vancomycin Rm: 19.2

## 2010-12-10 LAB — RENAL FUNCTION PANEL
Albumin: 1.5 — ABNORMAL LOW
Albumin: 1.6 — ABNORMAL LOW
Calcium: 8.1 — ABNORMAL LOW
Chloride: 101
Chloride: 99
Creatinine, Ser: 6.45 — ABNORMAL HIGH
Creatinine, Ser: 8.63 — ABNORMAL HIGH
GFR calc Af Amer: 10 — ABNORMAL LOW
GFR calc Af Amer: 7 — ABNORMAL LOW
GFR calc non Af Amer: 6 — ABNORMAL LOW
GFR calc non Af Amer: 9 — ABNORMAL LOW
Phosphorus: 4.8 — ABNORMAL HIGH
Phosphorus: 9.2 — ABNORMAL HIGH
Potassium: 3.9

## 2010-12-10 LAB — CBC
MCV: 88.8
Platelets: 259
RDW: 19.8 — ABNORMAL HIGH
WBC: 15.8 — ABNORMAL HIGH

## 2010-12-10 LAB — HEPATITIS B SURFACE ANTIGEN: Hepatitis B Surface Ag: NEGATIVE

## 2010-12-16 ENCOUNTER — Ambulatory Visit (INDEPENDENT_AMBULATORY_CARE_PROVIDER_SITE_OTHER): Payer: Self-pay | Admitting: Cardiology

## 2010-12-16 DIAGNOSIS — R0989 Other specified symptoms and signs involving the circulatory and respiratory systems: Secondary | ICD-10-CM

## 2010-12-21 ENCOUNTER — Inpatient Hospital Stay (HOSPITAL_COMMUNITY)
Admission: EM | Admit: 2010-12-21 | Discharge: 2010-12-24 | DRG: 193 | Disposition: A | Discharge status: Home / Self Care | Payer: Medicare Other | Attending: Family Medicine | Admitting: Family Medicine

## 2010-12-21 ENCOUNTER — Emergency Department (HOSPITAL_COMMUNITY): Payer: Medicare Other

## 2010-12-21 DIAGNOSIS — Z7901 Long term (current) use of anticoagulants: Secondary | ICD-10-CM

## 2010-12-21 DIAGNOSIS — Z79899 Other long term (current) drug therapy: Secondary | ICD-10-CM

## 2010-12-21 DIAGNOSIS — D72829 Elevated white blood cell count, unspecified: Secondary | ICD-10-CM | POA: Diagnosis present

## 2010-12-21 DIAGNOSIS — Z992 Dependence on renal dialysis: Secondary | ICD-10-CM

## 2010-12-21 DIAGNOSIS — E871 Hypo-osmolality and hyponatremia: Secondary | ICD-10-CM | POA: Diagnosis present

## 2010-12-21 DIAGNOSIS — K5909 Other constipation: Secondary | ICD-10-CM | POA: Diagnosis present

## 2010-12-21 DIAGNOSIS — J189 Pneumonia, unspecified organism: Principal | ICD-10-CM | POA: Diagnosis present

## 2010-12-21 DIAGNOSIS — N186 End stage renal disease: Secondary | ICD-10-CM | POA: Diagnosis present

## 2010-12-21 DIAGNOSIS — F3289 Other specified depressive episodes: Secondary | ICD-10-CM | POA: Diagnosis present

## 2010-12-21 DIAGNOSIS — D509 Iron deficiency anemia, unspecified: Secondary | ICD-10-CM | POA: Diagnosis present

## 2010-12-21 DIAGNOSIS — N2581 Secondary hyperparathyroidism of renal origin: Secondary | ICD-10-CM | POA: Diagnosis present

## 2010-12-21 DIAGNOSIS — F329 Major depressive disorder, single episode, unspecified: Secondary | ICD-10-CM | POA: Diagnosis present

## 2010-12-21 DIAGNOSIS — I498 Other specified cardiac arrhythmias: Secondary | ICD-10-CM | POA: Diagnosis not present

## 2010-12-21 DIAGNOSIS — I4891 Unspecified atrial fibrillation: Secondary | ICD-10-CM | POA: Diagnosis present

## 2010-12-21 DIAGNOSIS — I120 Hypertensive chronic kidney disease with stage 5 chronic kidney disease or end stage renal disease: Secondary | ICD-10-CM | POA: Diagnosis present

## 2010-12-21 DIAGNOSIS — K219 Gastro-esophageal reflux disease without esophagitis: Secondary | ICD-10-CM | POA: Diagnosis present

## 2010-12-21 LAB — IRON AND TIBC
Iron: 47 ug/dL (ref 42–135)
UIBC: 105 ug/dL — ABNORMAL LOW (ref 125–400)

## 2010-12-21 LAB — DIFFERENTIAL
Basophils Absolute: 0 10*3/uL (ref 0.0–0.1)
Basophils Relative: 0 % (ref 0–1)
Eosinophils Absolute: 0.4 10*3/uL (ref 0.0–0.7)
Eosinophils Relative: 4 % (ref 0–5)
Monocytes Absolute: 0.8 10*3/uL (ref 0.1–1.0)
Monocytes Relative: 7 % (ref 3–12)
Neutro Abs: 8.5 10*3/uL — ABNORMAL HIGH (ref 1.7–7.7)

## 2010-12-21 LAB — BASIC METABOLIC PANEL
Calcium: 9.7 mg/dL (ref 8.4–10.5)
GFR calc Af Amer: 8 mL/min — ABNORMAL LOW (ref >90)
GFR calc non Af Amer: 7 mL/min — ABNORMAL LOW (ref >90)
Potassium: 4.7 mEq/L (ref 3.5–5.1)
Sodium: 133 mEq/L — ABNORMAL LOW (ref 135–145)

## 2010-12-21 LAB — CBC
MCH: 32.4 pg (ref 26.0–34.0)
MCHC: 32.2 g/dL (ref 30.0–36.0)
RDW: 17 % — ABNORMAL HIGH (ref 11.5–15.5)

## 2010-12-21 LAB — PROTIME-INR
INR: 1.99 — ABNORMAL HIGH (ref 0.00–1.49)
Prothrombin Time: 22.9 seconds — ABNORMAL HIGH (ref 11.6–15.2)

## 2010-12-22 DIAGNOSIS — I4891 Unspecified atrial fibrillation: Secondary | ICD-10-CM

## 2010-12-22 LAB — CARDIAC PANEL(CRET KIN+CKTOT+MB+TROPI)
CK, MB: 3.2 ng/mL (ref 0.3–4.0)
CK, MB: 3.9 ng/mL (ref 0.3–4.0)
Relative Index: 2.1 (ref 0.0–2.5)
Total CK: 192 U/L (ref 7–232)
Total CK: 185 U/L (ref 7–232)
Troponin I: <0.3 ng/mL (ref <0.30)
Troponin I: <0.3 ng/mL (ref <0.30)

## 2010-12-22 LAB — BASIC METABOLIC PANEL
BUN: 61 mg/dL — ABNORMAL HIGH (ref 6–23)
Calcium: 9.6 mg/dL (ref 8.4–10.5)
Creatinine, Ser: 5.35 mg/dL — ABNORMAL HIGH (ref 0.50–1.35)
GFR calc Af Amer: 11 mL/min — ABNORMAL LOW (ref >90)

## 2010-12-22 LAB — FOLATE: Folate: >20 ng/mL

## 2010-12-22 LAB — PROTIME-INR
INR: 2.12 — ABNORMAL HIGH (ref 0.00–1.49)
Prothrombin Time: 24.1 seconds — ABNORMAL HIGH (ref 11.6–15.2)

## 2010-12-22 LAB — FERRITIN: Ferritin: 2608 ng/mL — ABNORMAL HIGH (ref 22–322)

## 2010-12-23 ENCOUNTER — Inpatient Hospital Stay (HOSPITAL_COMMUNITY): Payer: Medicare Other

## 2010-12-23 LAB — RENAL FUNCTION PANEL
CO2: 25 mEq/L (ref 19–32)
Calcium: 9.5 mg/dL (ref 8.4–10.5)
Creatinine, Ser: 6.53 mg/dL — ABNORMAL HIGH (ref 0.50–1.35)
GFR calc Af Amer: 8 mL/min — ABNORMAL LOW (ref >90)
GFR calc non Af Amer: 7 mL/min — ABNORMAL LOW (ref >90)
Glucose, Bld: 99 mg/dL (ref 70–99)
Phosphorus: 5 mg/dL — ABNORMAL HIGH (ref 2.3–4.6)
Sodium: 137 mEq/L (ref 135–145)

## 2010-12-23 LAB — CBC
MCH: 32.6 pg (ref 26.0–34.0)
MCHC: 32.6 g/dL (ref 30.0–36.0)
MCV: 100 fL (ref 78.0–100.0)
Platelets: 155 10*3/uL (ref 150–400)

## 2010-12-23 LAB — PROTIME-INR: Prothrombin Time: 25.6 seconds — ABNORMAL HIGH (ref 11.6–15.2)

## 2010-12-24 ENCOUNTER — Inpatient Hospital Stay (HOSPITAL_COMMUNITY): Payer: Medicare Other

## 2010-12-24 LAB — PROTIME-INR: INR: 2.13 — ABNORMAL HIGH (ref 0.00–1.49)

## 2010-12-24 LAB — GLUCOSE, CAPILLARY: Glucose-Capillary: 98 mg/dL (ref 70–99)

## 2010-12-27 LAB — CULTURE, BLOOD (ROUTINE X 2)
Culture  Setup Time: 201210092149
Culture: NO GROWTH

## 2010-12-28 LAB — PROTIME-INR: INR: 2.6 — AB (ref 0.9–1.1)

## 2010-12-29 ENCOUNTER — Ambulatory Visit (INDEPENDENT_AMBULATORY_CARE_PROVIDER_SITE_OTHER): Payer: Self-pay | Admitting: Cardiovascular Disease

## 2010-12-29 DIAGNOSIS — Z7901 Long term (current) use of anticoagulants: Secondary | ICD-10-CM

## 2010-12-29 DIAGNOSIS — R0989 Other specified symptoms and signs involving the circulatory and respiratory systems: Secondary | ICD-10-CM

## 2010-12-29 DIAGNOSIS — I4891 Unspecified atrial fibrillation: Secondary | ICD-10-CM

## 2010-12-30 LAB — PROTIME-INR: INR: 2.4 — AB (ref 0.9–1.1)

## 2011-01-03 ENCOUNTER — Ambulatory Visit: Payer: Self-pay | Admitting: Internal Medicine

## 2011-01-03 DIAGNOSIS — Z7901 Long term (current) use of anticoagulants: Secondary | ICD-10-CM

## 2011-01-03 DIAGNOSIS — I4891 Unspecified atrial fibrillation: Secondary | ICD-10-CM

## 2011-01-04 ENCOUNTER — Encounter: Payer: Self-pay | Admitting: Physician Assistant

## 2011-01-07 ENCOUNTER — Encounter: Payer: Self-pay | Admitting: Physician Assistant

## 2011-01-07 ENCOUNTER — Ambulatory Visit (HOSPITAL_COMMUNITY)
Admission: RE | Admit: 2011-01-07 | Discharge: 2011-01-07 | Disposition: A | Discharge status: Home / Self Care | Payer: Medicare Other | Source: Ambulatory Visit | Attending: Cardiology | Admitting: Cardiology

## 2011-01-07 ENCOUNTER — Ambulatory Visit (INDEPENDENT_AMBULATORY_CARE_PROVIDER_SITE_OTHER): Payer: 59 | Admitting: Physician Assistant

## 2011-01-07 ENCOUNTER — Telehealth: Payer: Self-pay | Admitting: Physician Assistant

## 2011-01-07 VITALS — BP 142/64 | HR 63 | Temp 97.0°F | Ht 68.0 in | Wt 186.0 lb

## 2011-01-07 DIAGNOSIS — Z01818 Encounter for other preprocedural examination: Secondary | ICD-10-CM | POA: Insufficient documentation

## 2011-01-07 DIAGNOSIS — I10 Essential (primary) hypertension: Secondary | ICD-10-CM

## 2011-01-07 DIAGNOSIS — I4891 Unspecified atrial fibrillation: Secondary | ICD-10-CM

## 2011-01-07 DIAGNOSIS — I2510 Atherosclerotic heart disease of native coronary artery without angina pectoris: Secondary | ICD-10-CM

## 2011-01-07 DIAGNOSIS — R0602 Shortness of breath: Secondary | ICD-10-CM | POA: Insufficient documentation

## 2011-01-07 DIAGNOSIS — I509 Heart failure, unspecified: Secondary | ICD-10-CM

## 2011-01-07 DIAGNOSIS — I5032 Chronic diastolic (congestive) heart failure: Secondary | ICD-10-CM

## 2011-01-07 LAB — CBC WITH DIFFERENTIAL/PLATELET
Basophils Absolute: 0 10*3/uL (ref 0.0–0.1)
Basophils Relative: 0.2 % (ref 0.0–3.0)
Eosinophils Absolute: 0.7 10*3/uL (ref 0.0–0.7)
Hemoglobin: 10.7 g/dL — ABNORMAL LOW (ref 13.0–17.0)
Lymphocytes Relative: 14.3 % (ref 12.0–46.0)
Monocytes Relative: 8.8 % (ref 3.0–12.0)
Neutro Abs: 6.6 10*3/uL (ref 1.4–7.7)
Neutrophils Relative %: 69.1 % (ref 43.0–77.0)
RBC: 3.14 Mil/uL — ABNORMAL LOW (ref 4.22–5.81)
RDW: 18.7 % — ABNORMAL HIGH (ref 11.5–14.6)

## 2011-01-07 MED ORDER — MOXIFLOXACIN HCL 400 MG PO TABS: 400.0000 mg | ORAL_TABLET | Freq: Every day | ORAL | Status: AC

## 2011-01-07 NOTE — Patient Instructions (Signed)
Your physician recommends that you schedule a follow-up appointment in: FOLLOW UP WITH DR. Hyman Hopes NEXT WEEK PER SCOTT WEAVER, PA-C  A chest x-ray TODAY @ [facility] DX 786.05 SOB takes a picture of the organs and structures inside the chest, including the heart, lungs, and blood vessels. This test can show several things, including, whether the heart is enlarges; whether fluid is building up in the lungs; and whether pacemaker / defibrillator leads are still in place.  Your physician has requested that you have an echocardiogram DX 786.05 SOB. Echocardiography is a painless test that uses sound waves to create images of your heart. It provides your doctor with information about the size and shape of your heart and how well your heart's chambers and valves are working. This procedure takes approximately one hour. There are no restrictions for this procedure.  Your physician has recommended you make the following change in your medication: START AVELOX 400 MG 1 TABLET DAILY FOR 7 DAYS ONLY  Your physician recommends that you return for lab work in: TODAY CBC W/DIFF 786.05 SOB  Your physician recommends that you schedule a follow-up appointment in: 1-2 WEEKS WITH DR. Shirlee Latch OR SCOTT WEAVER, PA-C IF NO AVAILABILITY FOR DR. Shirlee Latch

## 2011-01-07 NOTE — Telephone Encounter (Signed)
S/w pt's wife and she is aware of cxr results today. I advised pt to please let dialysis know that pt has been more sob. Danielle Rankin

## 2011-01-07 NOTE — Telephone Encounter (Signed)
CXR from today is ok. I still want him to take the antibx until gone. Go to dialysis tomorrow and make sure he tells them he has been more short of breath. Follow up with renal doc next week and here as planned.  Call if no better. Please send to Dr. Shirlee Latch after you speak to patient as the CXR results went to him instead of me. Tereso Newcomer, PA-C

## 2011-01-07 NOTE — Assessment & Plan Note (Signed)
As noted, if no significant improvement with above treatment, consider myoview to r/o ischemia.

## 2011-01-07 NOTE — Assessment & Plan Note (Signed)
Fair control.  No changes for now.

## 2011-01-07 NOTE — Assessment & Plan Note (Signed)
Volume looks stable.  No dialysis sessions missed.  He has dialysis tomorrow.  I suspect dyspnea 2/2 residual pneumonia.

## 2011-01-07 NOTE — Assessment & Plan Note (Signed)
I suspect he has residual pneumonia.  He does not look volume overloaded to me and has dialysis tomorrow.  His ECG is normal.  BP is ok.  He had minimal nonobstructive CAD 2 years ago.  I doubt ischemia.  But, will need to consider stress myoview at some point.  For now, I will put him on Avelox 400 mg QD for 7 days.  Get CXR today and CBC with diff.  He will see one of the docs at dialysis next Tuesday.  Schedule 2D echo.  I will bring him back to see Dr. Shirlee Latch or me in 1-2 weeks.

## 2011-01-07 NOTE — Assessment & Plan Note (Signed)
Recurrent AFib occurred in context of acute illness.  No apparent recurrence.  Continue coumadin.  Continue rate control with metoprolol.

## 2011-01-07 NOTE — Progress Notes (Signed)
History of Present Illness: Primary Cardiologist:  Dr. Marca Ancona  Casey [name] is a 77 y.o. male who presents for post hospital follow up.  He has a history of vascular disease, ESRD, paroxysmal atrial fibrillation, and diastolic CHF.   He was admitted 10/9-10/12 with right sided pneumonia.  Cardiology saw him due to recurrent AFib with RVR.  He spontaneously converted to NSR.  His metoprolol was increased.  He was treated with antibxs per the primary service.  He was kept on his coumadin.  Since d/c he had been doing ok up until today.  He notes increased DOE today.  Really describes Class 3 symptoms.  No dyspnea at rest.  No chest pain.  He has a chronic cough.  Never really was any worse when he was admitted for pneumonia.  No fevers, but had no fevers prior to admission to the hospital.  Denies syncope.  No palpitations.  No orthopnea.  Has awoken suddenly short of breath.  No edema.  No weight gain.  Has not missed dialysis.  Has reached dry weight with dialysis recently as well and does not feel like he is volume overloaded.    Past Medical History:  1. End-stage renal disease (PD now HD T, R, Sa). Had MRSE peritonitis with PD, necessitating change to HD. Secondary hyperparathyroidism.  2. Depression  3. Hypertension  4. Nephrolithiasis  5. Esophageal dysmotility  6. GERD, small hiatal hernia, h/o peptic stricture, duodenitis on 9/11 EGD.  7. Renal vascular disease: Occluded renal arteries bilaterally on cath 3/10.  8. CAD: Nonobstructive. LHC (3/10) with 30% ostial RCA, 30% ostial LAD, 40% ostial D1. EF 50%.  9. Diastolic CHF: Chronic exertional dyspnea. Echo (7/11): EF 50-55%, moderate LV hypertrophy, no regional WMAs, mild AI, mild AS, mild MR.  10. Paroxysmal atrial fibrillation: Noted first in 6/11.  11. Mild aortic stenosis  12. Carotid stenosis: 40-59% RICA by dopplers (7/11)  13. ABIs (7/11): normal    Current Outpatient Prescriptions  Medication Sig Dispense Refill    . amLODipine (NORVASC) 10 MG tablet Take 1 tablet by mouth daily.      Marland Kitchen b complex-vitamin c-folic acid (NEPHRO-VITE) 0.8 MG TABS Take 0.8 mg by mouth at bedtime.        . calcium citrate-vitamin D (CITRACAL+D) 315-200 MG-UNIT per tablet Take 1 tablet by mouth 3 (three) times daily.        . fosinopril (MONOPRIL) 40 MG tablet Take 1 tablet by mouth daily.      . metoprolol succinate (TOPROL-XL) 25 MG 24 hr tablet Take 2 tablets by mouth Twice daily.      Marland Kitchen omeprazole (PRILOSEC) 20 MG capsule Take 40 mg by mouth daily.       Marland Kitchen oxycodone (OXY-IR) 5 MG capsule Take 5 mg by mouth every 4 (four) hours as needed.        Marland Kitchen PARoxetine (PAXIL) 30 MG tablet Take 1 tablet by mouth daily.      . Probiotic Product (PROBIOTIC PO) Take by mouth daily.        Marland Kitchen warfarin (COUMADIN) 5 MG tablet Take 1 tablet (5 mg total) by mouth as directed.  135 tablet  1  . moxifloxacin (AVELOX) 400 MG tablet Take 1 tablet (400 mg total) by mouth daily.  7 tablet  0    Allergies: No Known Allergies  Family History:  CVA- mother and father  No FH of Colon Cancer:   Social History:  Married, 1 boy, 2 girls. Lives  in [place].  Patient has never smoked.  Alcohol Use - no  Illicit Drug Use - no  ROS:  Please see the history of present illness.  Has constipation and diarrhea caused by his medications.  All other systems reviewed and negative.   Vital Signs: BP 142/64  Pulse 63  Temp 97 F (36.1 C)  Ht 5\' 8"  (1.727 m)  Wt 186 lb (84.369 kg)  BMI 28.28 kg/m2  SpO2 93%  PHYSICAL EXAM: Well nourished, well developed, in no acute distress HEENT: normal Neck: no JVD Cardiac:  normal S1, S2; RRR; no murmur Lungs:  Faint crackles right base, no wheezing, no rhonchi Abd: soft, nontender, no hepatomegaly Ext: trace bilat ankle edema Skin: warm and dry Neuro:  CNs 2-12 intact, no focal abnormalities noted Psych: normal affect  EKG:  NSR, HR 63, no ischemic changes  ASSESSMENT AND PLAN:

## 2011-01-10 NOTE — Discharge Summary (Signed)
Casey [name], Casey [name] NO.:  [id number]  MEDICAL RECORD NO.:  [id number]  LOCATION:  5507                         FACILITY:  [facility]  PHYSICIAN:  Mauro Kaufmann, MD         DATE OF BIRTH:  1931/09/19  DATE OF ADMISSION:  12/21/2010 DATE OF DISCHARGE:  12/24/2010                              DISCHARGE SUMMARY   ADMISSION DIAGNOSES: 1. Healthcare-associated pneumonia. 2. End-stage renal disease. 3. Atrial fibrillation. 4. Hypertension. 5. Gastroesophageal reflux disease. 6. Depression. 7. Microcytic anemia. 8. Leukocytosis. 9. Mild hyponatremia.  DISCHARGE DIAGNOSES: 1. Healthcare-associated pneumonia versus community-acquired     pneumonia, improved and the chest x-ray done on December 24, 2010     showed no active lung disease. 2. End-stage renal disease. 3. Atrial fibrillation.  TESTS PERFORMED DURING THE HOSPITAL STAY: 1. Chest x-ray on December 21, 2010 showed multifocal right-sided     airspace disease consistent with infection. 2. Chest x-ray on December 24, 2010 showed no active lung disease.  PERTINENT LABORATORY DATA:  WBC of 10.8 and hemoglobin 9.1.  INR is 2.13.  Sodium 137, potassium 4.7, chloride 96, CO2 25, BUN 81, creatinine 6.53, and glucose 99.  CONSULTS OBTAINED DURING HOSPITAL STAY: 1. Cardiology consult. 2. Nephrology consultation.  BRIEF HISTORY AND PHYSICAL:  This is a 77 year old male with a history of end-stage renal disease who goes to dialysis on Tuesdays, Thursdays, and Saturdays and also has a history of hypertension and atrial fibrillation, came to the hospital because he had 3-4 episodes of emesis, and also complained of diarrhea.  Also, the patient developed shortness of breath and dry cough, but no chest pain.  The patient was found to have pneumonia on the chest x-ray and was started on IV antibiotics and treated for healthcare-associated pneumonia.  HOSPITAL COURSE: 1. Healthcare-associated pneumonia.  The patient was  started     empirically on vancomycin, Levaquin, and cefepime and after 3 days     of antibiotic therapy, the patient's chest x-ray has cleared up.     The patient is afebrile.  The blood cultures drawn on December 21, 2010 are negative so far.  The patient has normal white count as of     December 23, 2010.  So, the patient's antibiotics will be changed     and we will send the patient home on Levaquin for 5 more days.  As     the patient is on hemodialysis, he will require Levaquin 500 mg     every other day. 2. Atrial fibrillation with rapid response.  The patient was found to     be in AFib with rapid response.  The patient's metoprolol has been     increased to 50 mg p.o. b.i.d. and his heart rate is well     controlled.  The patient is also on Coumadin and his PT/INR is     checked at the dialysis center and it should be checked in the next     3 days time.  On the day of discharge, the patient's INR is 2.13. 3. End-stage renal disease.  The patient will be continue on his     hemodialysis  as outpatient.  DISCHARGE MEDICATIONS:1. Levaquin 500 mg p.o. every other day for 5 days. 2. Toprol-XL 50 mg p.o. b.i.d. 3. Amlodipine 10 mg p.o. daily. 4. Calcium plus vitamin D with Citracal 1 tablet p.o. 3 times a day     with meals. 5. Fosinopril 40 mg p.o. daily at bedtime. 6. Nephro-Vite 1 tablet p.o. daily. 7. Omeprazole 20 mg 2 capsules p.o. every morning. 8. Oxycodone 5 mg 1 tablet every 6 hours as needed. 9. Paroxetine 30 mg 1 tablet p.o. daily. 10.Probiotic Ultra 1 capsule every morning. 11.Warfarin 5 mg 1/2-1 tablet p.o. daily.  The patient takes 0.5     tablet Monday, Wednesday, Friday, Saturday and 1 tablet Sunday,     Tuesday, and Thursday.  FOLLOWUP:  The patient will follow up with Dr. Hyman Hopes as outpatient and also follow up with Dr. Daphine Deutscher in 2-4 weeks.  The patient will go to the [facility] for the outpatient dialysis and also to get his PT/INR checked.  The  patient was seen by Cardiology and they had recommended to increase the dose of metoprolol which has been done.     Mauro Kaufmann, MD     GL/MEDQ  D:  12/24/2010  T:  12/24/2010  Job:  [id number]  cc:   [name], M.D. Marca Ancona, MD  Electronically Signed by Mauro Kaufmann  on 01/10/2011 09:49:56 AM

## 2011-01-12 NOTE — H&P (Signed)
Casey [name], Casey [name] NO.:  [id number]  MEDICAL RECORD NO.:  [id number]  LOCATION:  [facility]                         FACILITY:  [facility]  PHYSICIAN:  Ladell Pier, M.D.   DATE OF BIRTH:  Jan 14, 1932  DATE OF ADMISSION:  12/21/2010 DATE OF DISCHARGE:                             HISTORY & PHYSICAL   CHIEF COMPLAINT:  Nausea and vomiting.  HISTORY OF PRESENT ILLNESS:  The patient is a 77 year old white male with past medical history significant for end-stage renal disease, hemodialysis Tuesdays, Thursdays, Saturdays who also has GERD, hypertension, atrial fibrillation.  The patient stated that for the past 2 days he has had 3-4 episodes of emesis.  He also complains of diarrhea, but he states that it is pretty chronic for him because he has chronic constipation, so he normally takes laxatives to keep himself regular.  He also complains of some shortness of breath and dry cough, but no chest pain, no fevers, no chills.  He is due for dialysis today. He normally goes Tuesdays, Thursdays, and Saturdays.  PAST MEDICAL HISTORY:  Significant for, 1. Atrial fibrillation. 2. Hypertension. 3. End-stage renal disease.  He is on hemodialysis Tuesdays,     Thursdays, Saturdays. 4. GERD. 5. Depression. 6. History of peritonitis secondary to peritoneal hemodialysis     catheter. 7. Cardiac catheterization in March 2010 shows LVEF of 50%.  FAMILY HISTORY:  Both parents are deceased.  Mother died from old age at 36.  Father died in his 51s from a stroke.  SOCIAL HISTORY:  The patient lives in [place], [place].  He does not smoke or drink alcohol or use IV drugs.  He has been married for over 54 years.  He has 3 children.  He is retired.  MEDICATIONS: 1. Citracal plus D 3 times a day with meals. 2. Omeprazole 20 mg 2 capsules every morning. 3. Probiotic every morning. 4. Warfarin 5 mg tablets half a tablet on Mondays, Wednesdays,     Fridays, and Saturdays and 1  tablets Sundays, Tuesdays, and     Thursdays. 5. Paroxetine 30 mg daily. 6. Metoprolol-XL 25 mg twice a day with meals. 7. Oxycodone 5 mg every 6 hours as needed. 8. Nephro-Vite daily. 9. Amlodipine 10 mg daily. 10.Lisinopril 40 mg daily at bedtime.  ALLERGIES:  None.  REVIEW OF SYSTEMS:  Negative otherwise stated in HPI.  PHYSICAL EXAM:  VITAL SIGNS:  Temperature 99.4, pulse of 69, respirations 21, blood pressure 157/59, pulse ox 95% on 2 L. GENERAL:  The patient is a Caucasian male, sitting up on the stretcher.  Does not seem to be in any acute distress.  No accessory muscle use. HEENT:  Head is normocephalic, atraumatic.  His pupils are reactive to light.  Throat was without erythema. CARDIOVASCULAR:  Regular rate and rhythm. LUNGS:  He has some coarse breath sounds on the arm right. ABDOMEN:  Soft, nontender, nondistended.  Positive bowel sounds. EXTREMITIES:  Trace edema bilaterally.  LABORATORY DATA:  Sodium 133, potassium 4.7, chloride 94, CO2 of 25, glucose 95, BUN 93, creatinine 6.64, calcium 9.7.  WBC 10.8, hemoglobin 11, MCV 100.9, platelets 143.  Chest x-ray, multifocal right-sided airspace disease consistent with infection.  Recommended  radiographic followup to evaluate for clearing.  ASSESSMENT: 1. Healthcare-associated right lung pneumonia. 2. End-stage renal disease. 3. Hypertension. 4. Atrial fibrillation. 5. Gastroesophageal reflux disease. 6. Depression. 7. Macrocytic anemia. 8. Leukocytosis. 9. Mild hyponatremia.  PLAN:  We will admit the patient to the hospital for healthcare- associated pneumonia.  We will place him on cefepime dosing per pharmacy and Levaquin and vancomycin.  We will do neb treatments and p.r.n. albuterol as needed and antitussives as needed.  Consulted Nephrology for his hemodialysis and continued him on his home medications.  For his microcytic anemia, we will check anemia panel.  Time spent with the patient and doing this  admission is approximately 45 minutes.     Ladell Pier, M.D.     [place]/MEDQ  D:  12/21/2010  T:  12/21/2010  Job:  [id number]  Electronically Signed by Ladell Pier M.D. on 01/12/2011 09:53:22 PM

## 2011-01-13 NOTE — Consult Note (Signed)
NAME:  Casey [name], Casey [name] NO.:  [id number]  MEDICAL RECORD NO.:  [id number]  LOCATION:  5507                         FACILITY:  [facility]  PHYSICIAN:  Hillis Range, MD       DATE OF BIRTH:  [birth year]  DATE OF CONSULTATION:  12/22/2010 DATE OF DISCHARGE:                                CONSULTATION   PRIMARY CARDIOLOGIST:  Marca Ancona, MD  NEPHROLOGIST:  [name], MD  PATIENT PROFILE:  This is a 77 year old male with prior history of paroxysmal atrial fibrillation, nonobstructive CAD as well as end-stage renal disease who was admitted on December 21, 2010 secondary to right- sided pneumonia whom we have been asked to evaluate secondary to recurrent paroxysmal atrial fibrillation.  PROBLEMS: 1. Paroxysmal atrial fibrillation.     a.     On chronic Coumadin therapy.  Follow up with [facility].     b.     History of normal left ventricular function. 2. Nonobstructive coronary artery disease by catheterization, May 29, 2008. 3. End-stage renal disease, on Tuesday Thursday Saturday dialysis. 4. Gastroesophageal reflux disease. 5. Depression. 6. Hypertension.  ALLERGIES:  ASPIRIN.  HISTORY OF PRESENT ILLNESS:  This is a 77 year old male with the above problem list.  The patient has a history of paroxysmal atrial fibrillation on chronic Coumadin therapy.  He also has a history of nonobstructive CAD by catheterization in 2010 with normal LV function by echo in 2011.  He has been followed closely in our Coumadin Clinic.  The patient notes occasional paroxysms of atrial fibrillation occurring may be 3 times per week, associated mild tachy palpitations.  He is unclear as to how long these episodes last, but says overall he is not symptomatic.  Over this past weekend, the patient developed progressive dyspnea with weakness.  Because of his symptoms, he missed dialysis this past Saturday and Tuesday was taken via EMS to the [facility]  ED where he was found to have right-sided pneumonia.  He has since been admitted and treated with Levaquin therapy with some improvement.  This morning, he was noted to have rapid atrial fibrillation with rates into the 170s at times.  This started about 6:30 this morning and after we just came out of his room, we were told by the monitor tech that his rate had dropped. We saw the monitor and he is currently in sinus rhythm in the 80s. While in AFib, the patient did not feel any tachy palpitations.  Denied any chest pain or dyspnea.  CURRENT MEDICATIONS: 1. Albuterol 2.5 inhaled q. 6 hours. 2. Amlodipine 10 mg daily (discontinued today). 3. Ipratropium 0.5 mg q.6 hours inhaled. 4. Levofloxacin 750 mg daily. 5. Metoprolol XL 25 mg b.i.d. 6. Lisinopril 40 mg daily (discontinued today). 7. Paroxetine 30 mg daily. 8. Renal vitamin daily.  FAMILY HISTORY:  Mother died with history of stroke and hypertension at 30.  Father died with a stroke in his 46s.  He has two sisters and one brother and he is unaware of their medical history. SOCIAL HISTORY:  [place].  He has 3  grown children.  He is retired.  Denies tobacco, alcohol, or drug use.  REVIEW OF SYSTEMS:  Positive for progressive dyspnea, weakness, and fevers.  He has also had upper and lower extremity edema which he attributes to having missed dialysis on Saturday.  He does have a history of depression.  He is full code.  Otherwise, all systems reviewed is negative.  PHYSICAL EXAMINATION:  VITAL SIGNS:  Temperature 97.9, heart rate 139, respirations 18, and blood pressure 111/70.  Follow up heart rate is 86 after converting spontaneously to sinus rhythm.  Pulse ox 93% on 3 L, weight is 84.7 kg. GENERAL:  Pleasant white male who is chronically ill-appearing in no acute distress.  Awake, alert, and oriented x3.  He has a normal affect. HEENT:  Normal. NEUROLOGIC:  Grossly intact,  nonfocal. SKIN:  Warm, dry, and flaking. NECK:  Supple without bruits or JVD. LUNGS:  Respirations are regular and unlabored with diminished breath sounds at the right base and egophony. CARDIAC:  Now regular S1, S2.  No S3, S4, or murmurs. ABDOMEN:  Round, soft, nontender, and nondistended.  Bowel sounds present x4. EXTREMITIES:  Warm, dry, pink with trace bilateral upper and lower extremity edema.  Distal pulses are 2+ and equal bilaterally.  IMAGING:  Chest x-ray on December 21, 2010 showed multifocal right-sided airspace disease consistent with infection.  EKG shows AFib with RVR, rate of 162, and no acute ST-T changes.  Hemoglobin 11.0, hematocrit 34.2, WBC 10.8, and platelets 143.  Sodium 133, potassium 4.7, chloride 94, CO2 25, BUN 93, creatinine 6.64, and glucose 97.  CK 192, MB 3.2, troponin less than 0.30, and INR 2.12.  ASSESSMENT AND PLAN: 1. Atrial fibrillation with rapid ventricular response.  The patient     has a history of paroxysmal atrial fibrillation and says that he     has mild tachy palpitations occurring about 3 times per week.     After we saw him this morning or this afternoon, he converted     spontaneously to sinus rhythm.  This being the case, we will     increase his metoprolol to 50 mg b.i.d.  To make blood pressure     room for this, his Norvasc has been discontinued.  We see that     Nephrology has also discontinued his ACE inhibitor.  He remains on     Coumadin therapy with therapeutic INR of 2.12.  If, the patient     paroxysms and atrial fibrillation become more symptomatic or more     frequent or there is concern for tachycardia-induced     cardiomyopathy, amiodarone would be our only antiarrhythmic option     given his renal failure. 2. History of atypical chest pain.  The patient reports that since     initiating dialysis, he occasionally gets focal left upper chest     discomfort.  This has been going on for several years and may be 3     times  per week, lasting minutes to hours at a time.  He had a     nonobstructive catheterization in 2008.  Doubt ischemia as a cause     of his     discomfort.  We will follow cardiac enzymes while here, however, we     would be unlikely to pursue an additional ischemic workup in the     absence of objective findings of ischemia. 3. End-stage renal disease.  Dialysis per Nephrology.     Nicolasa Ducking, ANP  ______________________________ Hillis Range, MD    CB/MEDQ  D:  12/22/2010  T:  12/22/2010  Job:  [id number]  Electronically Signed by Nicolasa Ducking ANP on 01/04/2011 04:18:00 PM Electronically Signed by Hillis Range MD on 01/13/2011 02:34:00 PM

## 2011-01-14 ENCOUNTER — Ambulatory Visit (INDEPENDENT_AMBULATORY_CARE_PROVIDER_SITE_OTHER): Payer: Self-pay | Admitting: Cardiovascular Disease

## 2011-01-14 ENCOUNTER — Ambulatory Visit (HOSPITAL_COMMUNITY): Payer: Medicare Other | Attending: Cardiovascular Disease | Admitting: Radiology

## 2011-01-14 DIAGNOSIS — Z7901 Long term (current) use of anticoagulants: Secondary | ICD-10-CM

## 2011-01-14 DIAGNOSIS — I359 Nonrheumatic aortic valve disorder, unspecified: Secondary | ICD-10-CM

## 2011-01-14 DIAGNOSIS — R0602 Shortness of breath: Secondary | ICD-10-CM

## 2011-01-14 DIAGNOSIS — I509 Heart failure, unspecified: Secondary | ICD-10-CM | POA: Insufficient documentation

## 2011-01-14 DIAGNOSIS — I4891 Unspecified atrial fibrillation: Secondary | ICD-10-CM

## 2011-01-14 DIAGNOSIS — R0989 Other specified symptoms and signs involving the circulatory and respiratory systems: Secondary | ICD-10-CM

## 2011-01-14 DIAGNOSIS — I079 Rheumatic tricuspid valve disease, unspecified: Secondary | ICD-10-CM | POA: Insufficient documentation

## 2011-01-14 DIAGNOSIS — R0609 Other forms of dyspnea: Secondary | ICD-10-CM | POA: Insufficient documentation

## 2011-01-14 DIAGNOSIS — I080 Rheumatic disorders of both mitral and aortic valves: Secondary | ICD-10-CM | POA: Insufficient documentation

## 2011-01-14 DIAGNOSIS — I2510 Atherosclerotic heart disease of native coronary artery without angina pectoris: Secondary | ICD-10-CM | POA: Insufficient documentation

## 2011-01-19 ENCOUNTER — Ambulatory Visit (INDEPENDENT_AMBULATORY_CARE_PROVIDER_SITE_OTHER): Payer: Medicare Other | Admitting: Cardiology

## 2011-01-19 ENCOUNTER — Encounter: Payer: Self-pay | Admitting: Cardiology

## 2011-01-19 ENCOUNTER — Ambulatory Visit: Payer: 59 | Admitting: Physician Assistant

## 2011-01-19 DIAGNOSIS — R079 Chest pain, unspecified: Secondary | ICD-10-CM

## 2011-01-19 DIAGNOSIS — R0609 Other forms of dyspnea: Secondary | ICD-10-CM

## 2011-01-19 DIAGNOSIS — G4733 Obstructive sleep apnea (adult) (pediatric): Secondary | ICD-10-CM

## 2011-01-19 DIAGNOSIS — I2510 Atherosclerotic heart disease of native coronary artery without angina pectoris: Secondary | ICD-10-CM

## 2011-01-19 DIAGNOSIS — I359 Nonrheumatic aortic valve disorder, unspecified: Secondary | ICD-10-CM

## 2011-01-19 DIAGNOSIS — E785 Hyperlipidemia, unspecified: Secondary | ICD-10-CM

## 2011-01-19 DIAGNOSIS — I5032 Chronic diastolic (congestive) heart failure: Secondary | ICD-10-CM

## 2011-01-19 DIAGNOSIS — I509 Heart failure, unspecified: Secondary | ICD-10-CM

## 2011-01-19 DIAGNOSIS — I4891 Unspecified atrial fibrillation: Secondary | ICD-10-CM

## 2011-01-19 LAB — HEPATIC FUNCTION PANEL
AST: 22 U/L (ref 0–37)
Albumin: 3.4 g/dL — ABNORMAL LOW (ref 3.5–5.2)
Alkaline Phosphatase: 103 U/L (ref 39–117)
Total Protein: 6.8 g/dL (ref 6.0–8.3)

## 2011-01-19 LAB — LIPID PANEL
Cholesterol: 89 mg/dL (ref 0–200)
HDL: 30.7 mg/dL — ABNORMAL LOW (ref >39.00)
LDL Cholesterol: 37 mg/dL (ref 0–99)
Total CHOL/HDL Ratio: 3
Triglycerides: 109 mg/dL (ref 0.0–149.0)

## 2011-01-19 NOTE — Assessment & Plan Note (Signed)
Paroxysmal atrial fibrillation.  Patient is in sinus rhythm today.  No tachypalpitations since leaving the hospital. Continue coumadin and Toprol XL.

## 2011-01-19 NOTE — Patient Instructions (Signed)
Your physician recommends that you have  a FASTING lipid profile /liver profile today.  Your physician has requested that you have a lexiscan myoview. For further information please visit [email or web address] Please follow instruction sheet, as given.  Your physician has recommended that you have a sleep study. This test records several body functions during sleep, including: brain activity, eye movement, oxygen and carbon dioxide blood levels, heart rate and rhythm, breathing rate and rhythm, the flow of air through your mouth and nose, snoring, body muscle movements, and chest and belly movement.  Your physician wants you to follow-up in: 6 months with Dr Shirlee Latch. (May 2013)You will receive a reminder letter in the mail two months in advance. If you don't receive a letter, please call our office to schedule the follow-up appointment.

## 2011-01-19 NOTE — Assessment & Plan Note (Signed)
Patient's dyspnea could certainly be related to diastolic CHF.  Treatment will depend on adequate fluid removal at dialysis.

## 2011-01-19 NOTE — Progress Notes (Signed)
77 yo with history of vascular disease, ESRD, paroxysmal atrial fibrillation, and diastolic CHF presents for cardiology followup.  He was admitted 10/9-10/12 with right sided pneumonia.  Cardiology saw him due to recurrent AFib with RVR.  He spontaneously converted to NSR.  His metoprolol was increased.  He was treated with antibxs per the primary service.  He was kept on his coumadin.  He remains in NSR today.  Main complaint is increased exertional dyspnea over the last few months.  He is short of breath after walking about 50 feet. He will occasionally wake up short of breath at night.  He reports significant daytime sleepiness and snores loudly per his wife.  Yesterday, he had severe left-sided chest soreness after dialysis lasting for about 10 minutes.  He does not typically have episodes of chest pain associated with dialysis.  Echo was done in 11/12.  I reviewed the echo today.  To my read, it shows mild aortic stenosis with mild to moderate aortic insufficiency.  EF is low normal.    ECG: NSR, normal  Past Medical History:  1. End-stage renal disease (PD now HD T, R, Sa). Had MRSE peritonitis with PD, necessitating change to HD. Secondary hyperparathyroidism.  2. Depression  3. Hypertension  4. Nephrolithiasis  5. Esophageal dysmotility  6. GERD, small hiatal hernia, h/o peptic stricture, duodenitis on 9/11 EGD.  7. Renal vascular disease: Occluded renal arteries bilaterally on cath 3/10.  8. CAD: Nonobstructive. LHC (3/10) with 30% ostial RCA, 30% ostial LAD, 40% ostial D1. EF 50%.  9. Diastolic CHF: Chronic exertional dyspnea.  10. Paroxysmal atrial fibrillation: Noted first in 6/11.  11. Mild aortic stenosis, mild to moderate aortic insufficiency.  Echo (11/12): EF 50-55%,moderate LVH, mild AS (mean gradient 20 mmHg), mild-moderate AI, mild MR.  12. Carotid stenosis: 40-59% RICA by dopplers (7/11).  40-59% RICA, suggestion of left subclavian stenosis (9/12 dopplers).  13. ABIs (7/11):  normal    Current Outpatient Prescriptions  Medication Sig Dispense Refill  . amLODipine (NORVASC) 10 MG tablet Take 1 tablet by mouth daily.      Marland Kitchen b complex-vitamin c-folic acid (NEPHRO-VITE) 0.8 MG TABS Take 0.8 mg by mouth at bedtime.        . calcium citrate-vitamin D (CITRACAL+D) 315-200 MG-UNIT per tablet Take 1 tablet by mouth 3 (three) times daily.        . fosinopril (MONOPRIL) 40 MG tablet Take 1 tablet by mouth daily.      . metoprolol succinate (TOPROL-XL) 25 MG 24 hr tablet Take 2 tablets by mouth Twice daily.      Marland Kitchen moxifloxacin (AVELOX) 400 MG tablet Take 1 tablet (400 mg total) by mouth daily.  7 tablet  0  . omeprazole (PRILOSEC) 20 MG capsule Take 40 mg by mouth daily.       Marland Kitchen oxycodone (OXY-IR) 5 MG capsule Take 5 mg by mouth every 4 (four) hours as needed.        Marland Kitchen PARoxetine (PAXIL) 30 MG tablet Take 1 tablet by mouth daily.      . Probiotic Product (PROBIOTIC PO) Take by mouth daily.        Marland Kitchen warfarin (COUMADIN) 5 MG tablet Take 1 tablet (5 mg total) by mouth as directed.  135 tablet  1    Allergies: No Known Allergies  Family History:  CVA- mother and father  No FH of Colon Cancer:   Social History:  Married, 1 boy, 2 girls. Lives in [place].  Patient has  never smoked.  Alcohol Use - no  Illicit Drug Use - no  ROS:  Please see the history of present illness.  Has constipation and diarrhea caused by his medications.  All other systems reviewed and negative.   Vital Signs: BP 132/58  Pulse 78  Resp 18  Ht 5\' 8"  (1.727 m)  Wt 84.423 kg (186 lb 1.9 oz)  BMI 28.30 kg/m2  SpO2 76%  PHYSICAL EXAM: Well nourished, well developed, in no acute distress HEENT: normal Neck: no JVD Cardiac:  normal S1, S2; RRR; no murmur Lungs: clear to auscultation bilaterally.  Abd: soft, nontender, no hepatomegaly Ext: trace bilat ankle edema Skin: warm and dry Neuro:  CNs 2-12 intact, no focal abnormalities noted Psych: normal affect  ASSESSMENT AND PLAN:

## 2011-01-19 NOTE — Assessment & Plan Note (Signed)
I reviewed Mr Casey [name]'s echo today.  The aortic valve disease looks like mild AS and mild to moderate AI.  I do not think he should be significantly symptomatic from his valve.

## 2011-01-19 NOTE — Assessment & Plan Note (Signed)
Patient had nonobstructive disease on prior cath. It is possible that his exertional dyspnea could be ischemic diastolic dysfunction.  I am going to have him get a Lexiscan myoview to make sure that there is no evidence for ischemia or infarction.  Continue warfarin, CEI, Toprol XL.  I will, as above, start a statin.

## 2011-01-19 NOTE — Assessment & Plan Note (Addendum)
Patient screens positive for OSA.  This could certainly be adding to his dyspnea and fatigue.  I will order a sleep study.

## 2011-01-19 NOTE — Assessment & Plan Note (Signed)
Given known vascular disease (nonobstructive CAD and carotid stenosis), he should be on a statin.  I will get lipids/LFTs today to decide on the type of statin and dose to put him on.

## 2011-01-21 ENCOUNTER — Telehealth: Payer: Self-pay | Admitting: Cardiology

## 2011-01-21 DIAGNOSIS — E7800 Pure hypercholesterolemia, unspecified: Secondary | ICD-10-CM

## 2011-01-21 MED ORDER — PRAVASTATIN SODIUM 20 MG PO TABS: 20.0000 mg | ORAL_TABLET | Freq: Every evening | ORAL | Status: DC

## 2011-01-21 NOTE — Telephone Encounter (Signed)
Pt said she was called by Eunice Blase.  She is returning the call.

## 2011-01-21 NOTE — Telephone Encounter (Signed)
Patient aware of labs results and Md's recommendations. Pravastatin 20 mg prescription send to [facility] as requested per pt.

## 2011-01-26 ENCOUNTER — Ambulatory Visit (HOSPITAL_COMMUNITY): Payer: Medicare Other | Attending: Cardiology | Admitting: Radiology

## 2011-01-26 DIAGNOSIS — R0989 Other specified symptoms and signs involving the circulatory and respiratory systems: Secondary | ICD-10-CM | POA: Insufficient documentation

## 2011-01-26 DIAGNOSIS — R0609 Other forms of dyspnea: Secondary | ICD-10-CM | POA: Insufficient documentation

## 2011-01-26 DIAGNOSIS — R079 Chest pain, unspecified: Secondary | ICD-10-CM | POA: Insufficient documentation

## 2011-01-26 DIAGNOSIS — I10 Essential (primary) hypertension: Secondary | ICD-10-CM | POA: Insufficient documentation

## 2011-01-26 DIAGNOSIS — R0602 Shortness of breath: Secondary | ICD-10-CM | POA: Insufficient documentation

## 2011-01-26 DIAGNOSIS — E785 Hyperlipidemia, unspecified: Secondary | ICD-10-CM | POA: Insufficient documentation

## 2011-01-26 DIAGNOSIS — I779 Disorder of arteries and arterioles, unspecified: Secondary | ICD-10-CM | POA: Insufficient documentation

## 2011-01-26 DIAGNOSIS — I70219 Atherosclerosis of native arteries of extremities with intermittent claudication, unspecified extremity: Secondary | ICD-10-CM

## 2011-01-26 DIAGNOSIS — I4891 Unspecified atrial fibrillation: Secondary | ICD-10-CM

## 2011-01-26 MED ORDER — TECHNETIUM TC 99M TETROFOSMIN IV KIT
11.0000 | PACK | Freq: Once | INTRAVENOUS | Status: AC | PRN
  Administered 2011-01-26: 11 via INTRAVENOUS

## 2011-01-26 MED ORDER — TECHNETIUM TC 99M TETROFOSMIN IV KIT
33.0000 | PACK | Freq: Once | INTRAVENOUS | Status: AC | PRN
  Administered 2011-01-26: 33 via INTRAVENOUS

## 2011-01-26 MED ORDER — REGADENOSON 0.4 MG/5ML IV SOLN
0.4000 mg | Freq: Once | INTRAVENOUS | Status: AC
  Administered 2011-01-26: 0.4 mg via INTRAVENOUS

## 2011-01-26 NOTE — Progress Notes (Addendum)
[facility] SITE 3 NUCLEAR MED [address] [place] [address] [phone]  Cardiology Nuclear Med Zamarian Scarano is a 77 y.o. male [id number] 1931-05-25   Nuclear Med Background Indication for Stress Test:  Evaluation for Ischemia and Post Hospital History: 11/12 Echo: EF 50-55%, 05/29/08 Heart Catheterization: N/O CAD EF 50% and '05 Myocardial Perfusion Study: EF 47% NL, AFIB Cardiac Risk Factors: Carotid Disease, Hypertension and Lipids  Symptoms:  DOE and SOB   Nuclear Pre-Procedure Caffeine/Decaff Intake:  None NPO After: 9:00pm   Lungs:  clear IV 0.9% NS with Angio Cath:  20g  IV Site: R Antecubital  IV Started by:  Bonnita Levan, RN  Chest Size (in):  46 Cup Size: n/a  Height: 5\' 8"  (1.727 m)  Weight:  183 lb (83.008 kg)  BMI:  Body mass index is 27.82 kg/(m^2). Tech Comments:  Patient held all meds this AM    Nuclear Med Study 1 or 2 day study: 1 day  Stress Test Type:  Eugenie Birks  Reading MD: Olga Millers, MD  Order Authorizing Provider:  Dr. Golden Circle  Resting Radionuclide: Technetium 73m Tetrofosmin  Resting Radionuclide Dose: 11.0 mCi   Stress Radionuclide:  Technetium 6m Tetrofosmin  Stress Radionuclide Dose: 33.0 mCi           Stress Protocol Rest HR: 60 Stress HR: 75  Rest BP: 138/57 Stress BP: 132/57  Exercise Time (min): n/a METS: n/a   Predicted Max HR: 143 bpm % Max HR: 52.45 bpm Rate Pressure Product: 01027   Dose of Adenosine (mg):  n/a Dose of Lexiscan: 0.4 mg  Dose of Atropine (mg): n/a Dose of Dobutamine: n/a mcg/kg/min (at max HR)  Stress Test Technologist: Milana Na, EMT-P  Nuclear Technologist:  Domenic Polite, CNMT     Rest Procedure:  Myocardial perfusion imaging was performed at rest 45 minutes following the intravenous administration of Technetium 51m Tetrofosmin. Rest ECG: Sinus Bradycardia  Stress Procedure:  The patient received IV Lexiscan 0.4 mg over 15-seconds.  Technetium 86m  Tetrofosmin injected at 30-seconds.  There were no significant changes, sob, and woozy with Lexiscan.  Quantitative spect images were obtained after a 45 minute delay. Stress ECG: No significant ST segment change suggestive of ischemia.  QPS Raw Data Images:  Acquisition technically good; mild LVE. Stress Images:  Normal homogeneous uptake in all areas of the myocardium. Rest Images:  Normal homogeneous uptake in all areas of the myocardium. Subtraction (SDS):  No evidence of ischemia. Transient Ischemic Dilatation (Normal <1.22):  1.08 Lung/Heart Ratio (Normal <0.45):  0.27  Quantitative Gated Spect Images QGS EDV:  130 ml QGS ESV:  65 ml QGS cine images:  NL LV Function; NL Wall Motion QGS EF: 50%  Impression Exercise Capacity:  Lexiscan with no exercise. BP Response:  Normal blood pressure response. Clinical Symptoms:  No chest pain. ECG Impression:  No significant ST segment change suggestive of ischemia. Comparison with Prior Nuclear Study: No images to compare  Overall Impression:  Normal stress nuclear study.   Olga Millers    EF 50%.  No ischemia or infarction.  [employer]

## 2011-01-31 NOTE — Progress Notes (Signed)
Pt and wife notified of results.

## 2011-02-01 LAB — PROTIME-INR: INR: 2 — AB (ref <=1.1)

## 2011-02-04 ENCOUNTER — Ambulatory Visit (INDEPENDENT_AMBULATORY_CARE_PROVIDER_SITE_OTHER): Payer: Self-pay | Admitting: Cardiovascular Disease

## 2011-02-04 DIAGNOSIS — Z7901 Long term (current) use of anticoagulants: Secondary | ICD-10-CM

## 2011-02-04 DIAGNOSIS — I4891 Unspecified atrial fibrillation: Secondary | ICD-10-CM

## 2011-02-04 DIAGNOSIS — R0989 Other specified symptoms and signs involving the circulatory and respiratory systems: Secondary | ICD-10-CM

## 2011-02-08 ENCOUNTER — Ambulatory Visit (HOSPITAL_BASED_OUTPATIENT_CLINIC_OR_DEPARTMENT_OTHER): Payer: Medicare Other | Attending: Cardiology | Admitting: General Practice

## 2011-02-08 VITALS — Ht 68.0 in | Wt 183.0 lb

## 2011-02-08 DIAGNOSIS — R259 Unspecified abnormal involuntary movements: Secondary | ICD-10-CM | POA: Insufficient documentation

## 2011-02-08 DIAGNOSIS — G4733 Obstructive sleep apnea (adult) (pediatric): Secondary | ICD-10-CM | POA: Insufficient documentation

## 2011-02-08 DIAGNOSIS — R0989 Other specified symptoms and signs involving the circulatory and respiratory systems: Secondary | ICD-10-CM

## 2011-02-19 DIAGNOSIS — R259 Unspecified abnormal involuntary movements: Secondary | ICD-10-CM

## 2011-02-19 DIAGNOSIS — G4733 Obstructive sleep apnea (adult) (pediatric): Secondary | ICD-10-CM

## 2011-02-19 NOTE — Procedures (Signed)
NAME:  Casey [name], Casey [name] NO.:  [id number]  MEDICAL RECORD NO.:  [id number]          PATIENT TYPE:  OUT  LOCATION:  SLEEP CENTER                 FACILITY:  [facility]  PHYSICIAN:  Barbaraann Share, MD,FCCPDATE OF BIRTH:  07[birth year]/33  DATE OF STUDY:  02/08/2011                           NOCTURNAL POLYSOMNOGRAM  REFERRING PHYSICIAN:  Marca Ancona, MD  REFERRING PHYSICIAN:  Marca Ancona, MD  INDICATION FOR STUDY:  Hypersomnia with sleep apnea.  EPWORTH SLEEPINESS SCORE:  9.  SLEEP ARCHITECTURE:  The patient had a total sleep time of 368 minutes with no slow wave sleep or REM noted.  Sleep onset latency was normal at 6 minutes, and sleep efficiency was 72% during the diagnostic portion of the study and 89% during the titration portion of the study.  RESPIRATORY DATA:  The patient underwent a split night study, where he was found to have 135 obstructive and central events in the first 127 minutes of sleep.  This gave him an apnea-hypopnea index of 64 events per hour during the diagnostic portion of the study.  The events occurred in all body positions, and there was moderate snoring noted throughout.  By protocol, he was then fitted with a large Mirage Quattro full face mask, and CPAP titration was initiated.  The patient was titrated as high as 15 cm with breakthrough events noted, and therefore his pressure was increased to 17 cm.  He did not have any breakthrough events on that pressure, however, he was only on that level for a few minutes, and had no supine sleep or REM noted.  OXYGEN DATA:  There was O2 desaturation as low as 83% with the patient's obstructive events.  CARDIAC DATA:  No clinically significant arrhythmias were noted.  MOVEMENT-PARASOMNIA:  The patient had very large numbers of periodic limb movements, mostly occurring in the second half of the night.  There was significant sleep disruption with 7 per hour resulting in arousal or awakening.  There  were no abnormal behaviors noted.  IMPRESSIONS-RECOMMENDATIONS: 1. Split night study reveals severe obstructive sleep apnea with an     AHI of 64 events per hour, and O2 desaturation as low as 83%.  The     patient was then fitted with a large Mirage Quattro full face mask,     and titrated to a pressure as high as 17 cm of water.  There were     breakthrough events on 15 cm, but the patient did not achieve REM     or supine sleep on the final setting.  He also only spent 15     minutes on the final setting as well.  It is unclear whether 17 is     his optimal pressure, and this can be looked at more closely with     an auto CPAP setting at home.  The patient should also be     encouraged to work on modest weight loss. 2. Very large numbers of periodic limb movements with significant     sleep disruption.  It is unclear whether this is related to the     patient's sleep-disordered breathing, or if he has a concomitant  movement disorder of sleep.  Would recommend treating the patient's     sleep-disordered breathing first and see how he responds.  Clinical     correlation is also suggested.     Barbaraann Share, MD,FCCP Diplomate, American Board of Sleep Medicine Electronically Signed    [facility]/MEDQ  D:  02/19/2011 17:52:22  T:  02/19/2011 21:53:29  Job:  [id number]

## 2011-02-23 ENCOUNTER — Ambulatory Visit (INDEPENDENT_AMBULATORY_CARE_PROVIDER_SITE_OTHER): Payer: Self-pay | Admitting: Cardiovascular Disease

## 2011-02-23 ENCOUNTER — Telehealth: Payer: Self-pay | Admitting: Pulmonary Disease

## 2011-02-23 ENCOUNTER — Telehealth: Payer: Self-pay | Admitting: *Deleted

## 2011-02-23 DIAGNOSIS — G4730 Sleep apnea, unspecified: Secondary | ICD-10-CM

## 2011-02-23 DIAGNOSIS — Z7901 Long term (current) use of anticoagulants: Secondary | ICD-10-CM

## 2011-02-23 DIAGNOSIS — R0989 Other specified symptoms and signs involving the circulatory and respiratory systems: Secondary | ICD-10-CM

## 2011-02-23 DIAGNOSIS — I4891 Unspecified atrial fibrillation: Secondary | ICD-10-CM

## 2011-02-23 NOTE — Telephone Encounter (Signed)
Per Dr Delynn Flavin to Dr Shelle Iron for treatment of sleep apnea. See sleep study results 02/08/11. I talked with pt's wife and she is aware of referral to Dr Shelle Iron for pt.

## 2011-02-23 NOTE — Telephone Encounter (Signed)
Called and spoke with pt's wife.  Wife scheduled pt an appt with [facility] for 03/18/11 at 2:30 pm and is aware to arrive a few mins early to fill out paperwork.

## 2011-02-23 NOTE — Telephone Encounter (Signed)
Casey [name], this pt needs a sleep consult with me in the next few weeks.  Dr. Alford Highland nurse will be putting in a referral.

## 2011-03-04 ENCOUNTER — Ambulatory Visit
Admission: RE | Admit: 2011-03-04 | Discharge: 2011-03-04 | Disposition: A | Discharge status: Home / Self Care | Payer: Medicare Other | Source: Ambulatory Visit | Attending: Nephrology | Admitting: Nephrology

## 2011-03-04 ENCOUNTER — Other Ambulatory Visit: Payer: Self-pay | Admitting: Nephrology

## 2011-03-04 DIAGNOSIS — R6883 Chills (without fever): Secondary | ICD-10-CM

## 2011-03-04 DIAGNOSIS — R531 Weakness: Secondary | ICD-10-CM

## 2011-03-05 LAB — PROTIME-INR: INR: 1.7 — AB (ref 0.9–1.1)

## 2011-03-07 ENCOUNTER — Ambulatory Visit (INDEPENDENT_AMBULATORY_CARE_PROVIDER_SITE_OTHER): Payer: Self-pay | Admitting: Cardiovascular Disease

## 2011-03-07 DIAGNOSIS — R0989 Other specified symptoms and signs involving the circulatory and respiratory systems: Secondary | ICD-10-CM

## 2011-03-07 DIAGNOSIS — Z7901 Long term (current) use of anticoagulants: Secondary | ICD-10-CM

## 2011-03-07 DIAGNOSIS — I4891 Unspecified atrial fibrillation: Secondary | ICD-10-CM

## 2011-03-18 ENCOUNTER — Ambulatory Visit (INDEPENDENT_AMBULATORY_CARE_PROVIDER_SITE_OTHER): Payer: Medicare Other | Admitting: Pulmonary Disease

## 2011-03-18 ENCOUNTER — Encounter: Payer: Self-pay | Admitting: Pulmonary Disease

## 2011-03-18 VITALS — BP 110/70 | HR 60 | Temp 97.6°F | Ht 68.0 in | Wt 192.2 lb

## 2011-03-18 DIAGNOSIS — G4733 Obstructive sleep apnea (adult) (pediatric): Secondary | ICD-10-CM

## 2011-03-18 NOTE — Patient Instructions (Signed)
Will start on cpap at a moderate pressure level.  Please call if having pressure tolerance issues. Work on weight loss followup with me in 5 weeks.

## 2011-03-18 NOTE — Progress Notes (Signed)
  Subjective:    Patient ID: Casey [name], male    DOB: 05-01-1931, 78 y.o.   MRN: [id number]  HPI The patient is a 78 year old male who I've been asked to see for management of obstructive sleep apnea.  He recently underwent a sleep study, where he was found to have an AHI of 64 events per hour.  He was placed on CPAP and titrated to a level of 17 cm of water, and appeared to have fairly good control at this level.  The patient has been noted to have loud snoring, as well as an abnormal breathing pattern during sleep.  He notes frequent awakenings at night, occasionally with shortness of breath.  He is not rested in the mornings upon arising, and his spouse states that he takes a lot of naps during the day.  He will fall asleep easily watching television or reading.  The patient states that his weight has been stable over the last 2 years, and his Epworth sleepiness score at his recent sleep study was 9.  Sleep Questionnaire: What time do you typically go to bed?( Between what hours) anytime How long does it take you to fall asleep? 2 mins How many times during the night do you wake up? 3 What time do you get out of bed to start your day? Do you drive or operate heavy machinery in your occupation? No How much has your weight changed (up or down) over the past two years? (In pounds) 0 oz (0 kg) Have you ever had a sleep study before? Yes If yes, location of study? [facility] If yes, date of study? 02/2011 Do you currently use CPAP? No Do you wear oxygen at any time? No    Review of Systems  Constitutional: Positive for appetite change. Negative for fever and unexpected weight change.  HENT: Positive for sneezing. Negative for ear pain, nosebleeds, congestion, sore throat, rhinorrhea, trouble swallowing, dental problem, postnasal drip and sinus pressure.   Eyes: Negative for redness and itching.  Respiratory: Positive for cough and shortness of breath. Negative for chest tightness and wheezing.     Cardiovascular: Negative for palpitations and leg swelling.  Gastrointestinal: Positive for abdominal pain. Negative for nausea and vomiting.  Genitourinary: Negative for dysuria.  Musculoskeletal: Negative for joint swelling.  Skin: Negative for rash.  Neurological: Negative for headaches.  Hematological: Does not bruise/bleed easily.  Psychiatric/Behavioral: Positive for dysphoric mood. The patient is not nervous/anxious.        Objective:   Physical Exam Constitutional:  Obese male, no acute distress  HENT:  Nares patent without discharge but narrowed bilat  Oropharynx without exudate, palate and uvula are elongated  Eyes:  Perrla, eomi, no scleral icterus  Neck:  No JVD, no TMG  Cardiovascular:  ?irreg rhythm, no rubs or gallops.  2/6 sem        Intact distal pulses  Pulmonary :  Basilar crackles., no stridor or respiratory distress   No rhonchi, or wheezing  Abdominal:  Soft, nondistended, bowel sounds present.  No tenderness noted.   Musculoskeletal:  mild lower extremity edema noted.  Lymph Nodes:  No cervical lymphadenopathy noted  Skin:  No cyanosis noted  Neurologic:  Appears sleepy, appropriate, moves all 4 extremities without obvious deficit.         Assessment & Plan:

## 2011-03-18 NOTE — Assessment & Plan Note (Signed)
The patient has severe sleep apnea by his recent sleep study, and is clearly having significant sleep disruption and daytime sleepiness.  He also has underlying cardiovascular disease which can be negatively impacted by his sleep disordered breathing.  I have had a long discussion with him regarding the pathophysiology of sleep apnea, including its impact on his quality of life and cardiovascular health.  I think he needs to be started on CPAP, and he is agreeable.  I also encouraged him to work aggressively on weight loss.

## 2011-03-21 ENCOUNTER — Ambulatory Visit (INDEPENDENT_AMBULATORY_CARE_PROVIDER_SITE_OTHER): Payer: Self-pay | Admitting: Cardiology

## 2011-03-21 DIAGNOSIS — I4891 Unspecified atrial fibrillation: Secondary | ICD-10-CM

## 2011-03-21 DIAGNOSIS — R0989 Other specified symptoms and signs involving the circulatory and respiratory systems: Secondary | ICD-10-CM

## 2011-03-21 DIAGNOSIS — Z7901 Long term (current) use of anticoagulants: Secondary | ICD-10-CM

## 2011-04-05 LAB — PROTIME-INR: INR: 1.9 — AB (ref 0.9–1.1)

## 2011-04-06 ENCOUNTER — Ambulatory Visit (INDEPENDENT_AMBULATORY_CARE_PROVIDER_SITE_OTHER): Payer: Self-pay | Admitting: Cardiology

## 2011-04-06 DIAGNOSIS — I4891 Unspecified atrial fibrillation: Secondary | ICD-10-CM

## 2011-04-06 DIAGNOSIS — R0989 Other specified symptoms and signs involving the circulatory and respiratory systems: Secondary | ICD-10-CM

## 2011-04-06 DIAGNOSIS — Z7901 Long term (current) use of anticoagulants: Secondary | ICD-10-CM

## 2011-04-13 ENCOUNTER — Ambulatory Visit: Payer: Medicare Other | Admitting: Cardiology

## 2011-04-15 ENCOUNTER — Ambulatory Visit (INDEPENDENT_AMBULATORY_CARE_PROVIDER_SITE_OTHER): Payer: Medicare Other | Admitting: Cardiology

## 2011-04-15 ENCOUNTER — Telehealth: Payer: Self-pay | Admitting: Cardiology

## 2011-04-15 ENCOUNTER — Encounter: Payer: Self-pay | Admitting: Cardiology

## 2011-04-15 DIAGNOSIS — I6529 Occlusion and stenosis of unspecified carotid artery: Secondary | ICD-10-CM

## 2011-04-15 DIAGNOSIS — I951 Orthostatic hypotension: Secondary | ICD-10-CM

## 2011-04-15 DIAGNOSIS — I350 Nonrheumatic aortic (valve) stenosis: Secondary | ICD-10-CM

## 2011-04-15 DIAGNOSIS — I5032 Chronic diastolic (congestive) heart failure: Secondary | ICD-10-CM

## 2011-04-15 DIAGNOSIS — I2510 Atherosclerotic heart disease of native coronary artery without angina pectoris: Secondary | ICD-10-CM

## 2011-04-15 DIAGNOSIS — I359 Nonrheumatic aortic valve disorder, unspecified: Secondary | ICD-10-CM

## 2011-04-15 DIAGNOSIS — I509 Heart failure, unspecified: Secondary | ICD-10-CM

## 2011-04-15 DIAGNOSIS — I4891 Unspecified atrial fibrillation: Secondary | ICD-10-CM

## 2011-04-15 DIAGNOSIS — I5022 Chronic systolic (congestive) heart failure: Secondary | ICD-10-CM

## 2011-04-15 MED ORDER — PRAVASTATIN SODIUM 20 MG PO TABS: 20.0000 mg | ORAL_TABLET | Freq: Every evening | ORAL | Status: DC

## 2011-04-15 MED ORDER — METOPROLOL SUCCINATE ER 25 MG PO TB24: 25.0000 mg | ORAL_TABLET | Freq: Every day | ORAL | Status: DC

## 2011-04-15 MED ORDER — FOSINOPRIL SODIUM 10 MG PO TABS: 10.0000 mg | ORAL_TABLET | Freq: Every day | ORAL | Status: DC

## 2011-04-15 NOTE — Telephone Encounter (Signed)
Pt to have blood work done, wants to know if blood work done at dialysis will work for what we need?

## 2011-04-15 NOTE — Assessment & Plan Note (Signed)
The last echo in 11/12 appeared to show mild AS with mild to moderate AI.  He has chronic exertional dyspnea which has not changed.  His BP, however, has been running low and he is orthostatic.  On exam, AS murmur sounds moderate to severe. I am going to repeat a limited echo to look again at valve gradient and valve area to make sure that we are not missing more significant aortic stenosis.

## 2011-04-15 NOTE — Assessment & Plan Note (Signed)
Paroxysmal atrial fibrillation.  Patient is in sinus rhythm today.  Continue coumadin and Toprol XL.

## 2011-04-15 NOTE — Assessment & Plan Note (Signed)
Patient had nonobstructive disease on prior cath. Lexiscan myoview was normal in 11/12.  Given nonobstructive CAD and peripheral vascular disease, he was supposed to start pravastatin but is not on it today.  I will have him start pravastatin 20 mg daily for its pleiotropic effects (good LDL already).  Lipids/LFTs 2 months.

## 2011-04-15 NOTE — Progress Notes (Signed)
78 yo with history of vascular disease, ESRD, paroxysmal atrial fibrillation, and diastolic CHF presents for cardiology followup.  He was admitted 10/9-10/12/12 with right sided pneumonia.  Cardiology saw him due to recurrent AFib with RVR.  He spontaneously converted to NSR.  At last appointment, he reported increased exertional dyspnea.  Lexiscan myoview in 11/12 showed no ischemia or infarction.  Echo in 11/12 showed mild AS with mild to moderate AI.   Patient remains in NSR today.  He has developed significant orthostasis since I last saw him.  BP has been dropping considerably with standing (20 points systolic when measured today), and BP has gotten very low at dialysis.  Dr. Arrie Aran stopped amlodipine and cut down on fosinopril.  No chest pain.  Stable dyspnea after walking 50-100 feet.   Labs (11/12): LDL 37, HDL 31  Past Medical History:  1. End-stage renal disease (PD now HD T, R, Sa). Had MRSE peritonitis with PD, necessitating change to HD. Secondary hyperparathyroidism.  2. Depression  3. Hypertension  4. Nephrolithiasis  5. Esophageal dysmotility  6. GERD, small hiatal hernia, h/o peptic stricture, duodenitis on 9/11 EGD.  7. Renal vascular disease: Occluded renal arteries bilaterally on cath 3/10.  8. CAD: Nonobstructive. LHC (3/10) with 30% ostial RCA, 30% ostial LAD, 40% ostial D1. EF 50%.  Lexiscan myoview (11/12): EF 50%, no ischemia or infarction.  9. Diastolic CHF: Chronic exertional dyspnea.  10. Paroxysmal atrial fibrillation: Noted first in 6/11.  11. Mild aortic stenosis, mild to moderate aortic insufficiency.  Echo (11/12): EF 50-55%,moderate LVH, mild AS (mean gradient 20 mmHg), mild-moderate AI, mild MR.  12. Carotid stenosis: 40-59% RICA by dopplers (7/11).  40-59% RICA, suggestion of left subclavian stenosis (9/12 dopplers).  13. ABIs (7/11): normal  14. OSA: Uses CPAP   Current Outpatient Prescriptions  Medication Sig Dispense Refill  . b complex-vitamin  c-folic acid (NEPHRO-VITE) 0.8 MG TABS Take 0.8 mg by mouth at bedtime.        . calcium citrate-vitamin D (CITRACAL+D) 315-200 MG-UNIT per tablet Take 1 tablet by mouth 3 (three) times daily.        Marland Kitchen omeprazole (PRILOSEC) 20 MG capsule Take 40 mg by mouth daily.       Marland Kitchen oxycodone (OXY-IR) 5 MG capsule Take 5 mg by mouth every 6 (six) hours as needed.       Marland Kitchen oxyCODONE-acetaminophen (PERCOCET) 5-325 MG per tablet       . PARoxetine (PAXIL) 30 MG tablet Take 1 tablet by mouth daily.      . Probiotic Product (PROBIOTIC PO) Take by mouth daily.        Marland Kitchen warfarin (COUMADIN) 5 MG tablet Take 1 tablet (5 mg total) by mouth as directed.  135 tablet  1  . DISCONTD: fosinopril (MONOPRIL) 40 MG tablet Take 20 mg by mouth daily.       . fosinopril (MONOPRIL) 10 MG tablet Take 1 tablet (10 mg total) by mouth daily.  30 tablet  6  . metoprolol succinate (TOPROL XL) 25 MG 24 hr tablet Take 1 tablet (25 mg total) by mouth daily.  30 tablet  11  . pravastatin (PRAVACHOL) 20 MG tablet Take 1 tablet (20 mg total) by mouth every evening.  30 tablet  3    Allergies: No Known Allergies  Family History:  CVA- mother and father  No FH of Colon Cancer:   Social History:  Married, 1 boy, 2 girls. Lives in [place].  Patient has never smoked.  Alcohol Use - no  Illicit Drug Use - no  ROS:  Please see the history of present illness.  Has constipation and diarrhea caused by his medications.  All other systems reviewed and negative.   Vital Signs: BP 101/59  Pulse 72  Ht 5\' 8"  (1.727 m)  Wt 84.877 kg (187 lb 1.9 oz)  BMI 28.45 kg/m2  PHYSICAL EXAM: Well nourished, well developed, in no acute distress HEENT: normal Neck: no JVD Cardiac:  normal S1, S2; RRR; 3/6 mid-peaking systolic murmur RUSB with some obscuring of S2.  Murmur radiates to the neck.  Lungs: clear to auscultation bilaterally.  Abd: soft, nontender, no hepatomegaly Ext: trace bilat ankle edema Psych: normal affect

## 2011-04-15 NOTE — Assessment & Plan Note (Signed)
Patient was orthostatic by blood pressure today.  BP still getting low with dialysis.  Decrease fosinopril to 10 mg daily and decrease Toprol XL to 25 mg bid.

## 2011-04-15 NOTE — Telephone Encounter (Signed)
I talked with pt's wife. She is aware fasting lipid/liver profile scheduled here for 06/20/11. Pt will come here to have that done.

## 2011-04-15 NOTE — Assessment & Plan Note (Signed)
Patient's dyspnea could certainly be related to diastolic CHF.  Treatment will depend on adequate fluid removal at dialysis.  

## 2011-04-15 NOTE — Patient Instructions (Addendum)
Decrease fosinopril to 10mg  daily.  Decrease metoprolol (toprol Xl) to 25mg  daily.    Start pravachol 20mg  daily in the evening.   Your physician has requested that you have an echocardiogram. Echocardiography is a painless test that uses sound waves to create images of your heart. It provides your doctor with information about the size and shape of your heart and how well your heart's chambers and valves are working. This procedure takes approximately one hour. There are no restrictions for this procedure.  Your physician recommends that you return for a FASTING lipid profile /liver profile in 2 months.   Your physician wants you to follow-up in: 6 months with Dr Shirlee Latch. (August 2013) You will receive a reminder letter in the mail two months in advance. If you don't receive a letter, please call our office to schedule the follow-up appointment.

## 2011-04-15 NOTE — Assessment & Plan Note (Signed)
Repeat carotids 9/13.

## 2011-04-19 LAB — PROTIME-INR: INR: 2.1 — AB (ref 0.9–1.1)

## 2011-04-21 ENCOUNTER — Ambulatory Visit (INDEPENDENT_AMBULATORY_CARE_PROVIDER_SITE_OTHER): Payer: Self-pay | Admitting: Cardiovascular Disease

## 2011-04-21 DIAGNOSIS — R0989 Other specified symptoms and signs involving the circulatory and respiratory systems: Secondary | ICD-10-CM

## 2011-04-21 DIAGNOSIS — I4891 Unspecified atrial fibrillation: Secondary | ICD-10-CM

## 2011-04-21 DIAGNOSIS — Z7901 Long term (current) use of anticoagulants: Secondary | ICD-10-CM

## 2011-04-25 ENCOUNTER — Other Ambulatory Visit: Payer: Self-pay

## 2011-04-25 ENCOUNTER — Ambulatory Visit (HOSPITAL_COMMUNITY): Payer: Medicare Other | Attending: Cardiology | Admitting: Radiology

## 2011-04-25 DIAGNOSIS — I2510 Atherosclerotic heart disease of native coronary artery without angina pectoris: Secondary | ICD-10-CM | POA: Insufficient documentation

## 2011-04-25 DIAGNOSIS — I359 Nonrheumatic aortic valve disorder, unspecified: Secondary | ICD-10-CM

## 2011-04-25 DIAGNOSIS — Z992 Dependence on renal dialysis: Secondary | ICD-10-CM | POA: Insufficient documentation

## 2011-04-25 DIAGNOSIS — I350 Nonrheumatic aortic (valve) stenosis: Secondary | ICD-10-CM

## 2011-04-25 DIAGNOSIS — I4891 Unspecified atrial fibrillation: Secondary | ICD-10-CM | POA: Insufficient documentation

## 2011-04-25 DIAGNOSIS — I5022 Chronic systolic (congestive) heart failure: Secondary | ICD-10-CM

## 2011-04-25 DIAGNOSIS — I509 Heart failure, unspecified: Secondary | ICD-10-CM | POA: Insufficient documentation

## 2011-04-25 DIAGNOSIS — N186 End stage renal disease: Secondary | ICD-10-CM | POA: Insufficient documentation

## 2011-04-27 ENCOUNTER — Ambulatory Visit (INDEPENDENT_AMBULATORY_CARE_PROVIDER_SITE_OTHER): Payer: Medicare Other | Admitting: Pulmonary Disease

## 2011-04-27 ENCOUNTER — Encounter: Payer: Self-pay | Admitting: Pulmonary Disease

## 2011-04-27 VITALS — BP 138/66 | HR 86 | Temp 98.1°F | Ht 68.0 in | Wt 192.4 lb

## 2011-04-27 DIAGNOSIS — G4733 Obstructive sleep apnea (adult) (pediatric): Secondary | ICD-10-CM

## 2011-04-27 NOTE — Patient Instructions (Signed)
Will have your machine put on auto setting for next few weeks in order to optimize your pressure.  Will call you with the results. Work on [employer] loss Will send you over to sleep center for mask fitting. followup with me in 6mos, but stay in touch with how things are going or if still having issues.

## 2011-04-27 NOTE — Assessment & Plan Note (Signed)
The patient has been wearing CPAP fairly compliantly, and does see a difference in his sleep when he is able to wear more consistently.  He is having issues with mask fitting and leaks, and we will need to work on this with a fitting at the sleep Center.  I have also explained to the patient that we need to optimize his pressure, and we'll do this on the automatic setting for the next few weeks.  Finally, I have encouraged him to work aggressively on weight loss.

## 2011-04-27 NOTE — Progress Notes (Signed)
  Subjective:    Patient ID: Casey [name], male    DOB: 1931/05/05, 78 y.o.   MRN: [id number]  HPI The patient comes in today for followup of his known obstructive sleep apnea.  He has been started on CPAP, and is having no issues with pressure tolerance.  He is still having breakthrough events, and this is expected given his current CPAP setting and the severity of his sleep apnea.  We have yet to optimize his pressure for him.  His other complaint today is that of mask leaking, and is currently on his second mask.  We will need to work on his fit.   Review of Systems  Constitutional: Negative.  Negative for fever and unexpected weight change.  HENT: Positive for congestion. Negative for ear pain, nosebleeds, sore throat, rhinorrhea, sneezing, trouble swallowing, dental problem, postnasal drip and sinus pressure.   Eyes: Negative.  Negative for redness and itching.  Respiratory: Positive for cough. Negative for chest tightness, shortness of breath and wheezing.   Cardiovascular: Negative.  Negative for palpitations and leg swelling.  Gastrointestinal: Negative.  Negative for nausea and vomiting.  Genitourinary: Negative.  Negative for dysuria.  Musculoskeletal: Negative.  Negative for joint swelling.  Skin: Negative.  Negative for rash.  Neurological: Negative.  Negative for headaches.  Hematological: Negative.  Does not bruise/bleed easily.  Psychiatric/Behavioral: Negative.  Negative for dysphoric mood. The patient is not nervous/anxious.        Objective:   Physical Exam Overweight male in no acute distress No skin breakdown or pressure necrosis from the CPAP mask Lower extremities with edema, no cyanosis Alert and oriented, moves all 4 extremities.       Assessment & Plan:

## 2011-05-02 ENCOUNTER — Ambulatory Visit (HOSPITAL_BASED_OUTPATIENT_CLINIC_OR_DEPARTMENT_OTHER): Payer: Medicare Other | Attending: Pulmonary Disease

## 2011-05-02 DIAGNOSIS — G4733 Obstructive sleep apnea (adult) (pediatric): Secondary | ICD-10-CM

## 2011-05-03 ENCOUNTER — Other Ambulatory Visit: Payer: Self-pay | Admitting: Pulmonary Disease

## 2011-05-03 DIAGNOSIS — G4733 Obstructive sleep apnea (adult) (pediatric): Secondary | ICD-10-CM

## 2011-05-03 LAB — PROTIME-INR: INR: 1.8 — AB (ref 0.9–1.1)

## 2011-05-04 ENCOUNTER — Ambulatory Visit (INDEPENDENT_AMBULATORY_CARE_PROVIDER_SITE_OTHER): Payer: Self-pay | Admitting: Internal Medicine

## 2011-05-04 DIAGNOSIS — R0989 Other specified symptoms and signs involving the circulatory and respiratory systems: Secondary | ICD-10-CM

## 2011-05-04 DIAGNOSIS — Z7901 Long term (current) use of anticoagulants: Secondary | ICD-10-CM

## 2011-05-04 DIAGNOSIS — I4891 Unspecified atrial fibrillation: Secondary | ICD-10-CM

## 2011-05-05 ENCOUNTER — Telehealth: Payer: Self-pay | Admitting: Pulmonary Disease

## 2011-05-05 NOTE — Telephone Encounter (Signed)
Called [facility] [phone] and left message for Fayrene Fearing to return call. Order was sent 05/03/11.

## 2011-05-06 NOTE — Telephone Encounter (Signed)
Received fax from sleep center.  The patient was fitted with a Scientist, clinical (histocompatibility and immunogenetics).    I called [facility] and spoke with Kayla in RT Dept.  I informed her of above.  She verbalized understanding, nothing further needed, and Dorathy Daft will call pt to inform him ok to pick this mask up as they have it in stock.

## 2011-05-06 NOTE — Telephone Encounter (Signed)
Rena called back and stated she found this information. She will fax it to triage.  Will await fax.

## 2011-05-06 NOTE — Telephone Encounter (Signed)
Called [facility], spoke with Fayrene Fearing who informed we will need to speak with [facility] on this in the RT dept.  Spoke with Dorathy Daft who states they contacted the pt to set up a mask but the pt's wife stated the pt was placed on a mask in the office, and that's the mask the pt would like.  However, Dorathy Daft does not know which mask this is, and the pt's wife did not know which one either.  Per last OV note and orders, pt was going to go to the Sleep Center for a mask fitting.  I called the Sleep Center, spoke with Rena who advised the Sleep Techs are not in at the moment.  She will leave a message to return call.  Pt's wife is aware of above.

## 2011-05-09 ENCOUNTER — Telehealth: Payer: Self-pay | Admitting: Cardiology

## 2011-05-09 NOTE — Telephone Encounter (Signed)
Called wife/ husband is a patient of Dr Mclean/ explained out of office for next two days, advised her to call PCP for order , wife agreed to plan.

## 2011-05-09 NOTE — Telephone Encounter (Addendum)
New Problem   Patient wife Harriett Sine [phone] Cell   Request return call regarding RX for patient lift chair.  She can be reached on cell      Patient asked for Jodette, (Dr. Elease Hashimoto), routed call as requested

## 2011-05-10 LAB — PROTIME-INR: INR: 2.4 — AB (ref 0.9–1.1)

## 2011-05-11 ENCOUNTER — Ambulatory Visit: Payer: Self-pay | Admitting: Cardiovascular Disease

## 2011-05-11 DIAGNOSIS — I4891 Unspecified atrial fibrillation: Secondary | ICD-10-CM

## 2011-05-11 DIAGNOSIS — Z7901 Long term (current) use of anticoagulants: Secondary | ICD-10-CM

## 2011-05-18 ENCOUNTER — Other Ambulatory Visit (HOSPITAL_COMMUNITY): Payer: Self-pay | Admitting: Nephrology

## 2011-05-18 DIAGNOSIS — N186 End stage renal disease: Secondary | ICD-10-CM

## 2011-05-19 ENCOUNTER — Ambulatory Visit: Payer: Self-pay | Admitting: Cardiology

## 2011-05-19 ENCOUNTER — Encounter (HOSPITAL_COMMUNITY): Payer: Self-pay | Admitting: Pharmacy Technician

## 2011-05-19 DIAGNOSIS — Z7901 Long term (current) use of anticoagulants: Secondary | ICD-10-CM

## 2011-05-19 DIAGNOSIS — I4891 Unspecified atrial fibrillation: Secondary | ICD-10-CM

## 2011-05-20 ENCOUNTER — Ambulatory Visit (HOSPITAL_COMMUNITY)
Admission: RE | Admit: 2011-05-20 | Discharge: 2011-05-20 | Disposition: A | Discharge status: Home / Self Care | Payer: Medicare Other | Source: Ambulatory Visit | Attending: Nephrology | Admitting: Nephrology

## 2011-05-20 ENCOUNTER — Other Ambulatory Visit (HOSPITAL_COMMUNITY): Payer: Self-pay | Admitting: Nephrology

## 2011-05-20 DIAGNOSIS — N186 End stage renal disease: Secondary | ICD-10-CM

## 2011-05-20 DIAGNOSIS — Y849 Medical procedure, unspecified as the cause of abnormal reaction of the patient, or of later complication, without mention of misadventure at the time of the procedure: Secondary | ICD-10-CM | POA: Insufficient documentation

## 2011-05-20 DIAGNOSIS — T82898A Other specified complication of vascular prosthetic devices, implants and grafts, initial encounter: Secondary | ICD-10-CM | POA: Insufficient documentation

## 2011-05-20 MED ORDER — IOHEXOL 300 MG/ML  SOLN
100.0000 mL | Freq: Once | INTRAMUSCULAR | Status: AC | PRN
  Administered 2011-05-20: 50 mL via INTRAVENOUS

## 2011-05-20 MED ORDER — FENTANYL CITRATE 0.05 MG/ML IJ SOLN
INTRAMUSCULAR | Status: AC
  Filled 2011-05-20: qty 4

## 2011-05-20 MED ORDER — FENTANYL CITRATE 0.05 MG/ML IJ SOLN
INTRAMUSCULAR | Status: AC | PRN
  Administered 2011-05-20: 50 ug via INTRAVENOUS

## 2011-05-20 NOTE — ED Notes (Signed)
Discharge instructions reviewed with patient and wife and teaching sheets given. Discharged in w/c with self to be driven home by wife. Dressing right arm clean dry and intact.

## 2011-05-20 NOTE — ED Notes (Signed)
Patient in nurses station. VS stable. Given orange juice. Wife present. Thrill felt over fistula. Dressing left arm clean dry and intact.

## 2011-05-20 NOTE — Procedures (Signed)
Successful central left innominate venous PTA No comp Stable Ready for HD use

## 2011-05-23 ENCOUNTER — Telehealth (HOSPITAL_COMMUNITY): Payer: Self-pay | Admitting: Radiology

## 2011-05-26 ENCOUNTER — Ambulatory Visit: Payer: Self-pay | Admitting: Cardiology

## 2011-05-26 DIAGNOSIS — Z7901 Long term (current) use of anticoagulants: Secondary | ICD-10-CM

## 2011-05-26 DIAGNOSIS — I4891 Unspecified atrial fibrillation: Secondary | ICD-10-CM

## 2011-05-27 ENCOUNTER — Encounter: Payer: Self-pay | Admitting: Pulmonary Disease

## 2011-06-07 LAB — PROTIME-INR: INR: 3 — AB (ref 0.9–1.1)

## 2011-06-09 ENCOUNTER — Ambulatory Visit (INDEPENDENT_AMBULATORY_CARE_PROVIDER_SITE_OTHER): Payer: Self-pay | Admitting: Cardiology

## 2011-06-09 DIAGNOSIS — Z7901 Long term (current) use of anticoagulants: Secondary | ICD-10-CM

## 2011-06-09 DIAGNOSIS — I4891 Unspecified atrial fibrillation: Secondary | ICD-10-CM

## 2011-06-10 ENCOUNTER — Encounter: Payer: Self-pay | Admitting: Cardiology

## 2011-06-15 ENCOUNTER — Other Ambulatory Visit: Payer: Self-pay

## 2011-06-15 DIAGNOSIS — I350 Nonrheumatic aortic (valve) stenosis: Secondary | ICD-10-CM

## 2011-06-15 DIAGNOSIS — I5022 Chronic systolic (congestive) heart failure: Secondary | ICD-10-CM

## 2011-06-15 MED ORDER — PRAVASTATIN SODIUM 20 MG PO TABS: 20.0000 mg | ORAL_TABLET | Freq: Every evening | ORAL | Status: DC

## 2011-06-20 ENCOUNTER — Other Ambulatory Visit (INDEPENDENT_AMBULATORY_CARE_PROVIDER_SITE_OTHER): Payer: Medicare Other

## 2011-06-20 DIAGNOSIS — I359 Nonrheumatic aortic valve disorder, unspecified: Secondary | ICD-10-CM

## 2011-06-20 DIAGNOSIS — I350 Nonrheumatic aortic (valve) stenosis: Secondary | ICD-10-CM

## 2011-06-20 DIAGNOSIS — I5022 Chronic systolic (congestive) heart failure: Secondary | ICD-10-CM

## 2011-06-20 LAB — LIPID PANEL
Cholesterol: 82 mg/dL (ref 0–200)
LDL Cholesterol: 41 mg/dL (ref 0–99)
Total CHOL/HDL Ratio: 3
VLDL: 13.2 mg/dL (ref 0.0–40.0)

## 2011-06-20 LAB — HEPATIC FUNCTION PANEL
ALT: 20 U/L (ref 0–53)
AST: 20 U/L (ref 0–37)
Alkaline Phosphatase: 85 U/L (ref 39–117)
Total Bilirubin: 0.4 mg/dL (ref 0.3–1.2)

## 2011-06-21 LAB — PROTIME-INR: INR: 2.9 — AB (ref 0.9–1.1)

## 2011-06-23 ENCOUNTER — Ambulatory Visit: Payer: Self-pay | Admitting: Cardiology

## 2011-06-23 DIAGNOSIS — Z7901 Long term (current) use of anticoagulants: Secondary | ICD-10-CM

## 2011-06-23 DIAGNOSIS — I4891 Unspecified atrial fibrillation: Secondary | ICD-10-CM

## 2011-06-27 ENCOUNTER — Encounter: Payer: Self-pay | Admitting: Pulmonary Disease

## 2011-06-30 ENCOUNTER — Other Ambulatory Visit (HOSPITAL_COMMUNITY): Payer: Self-pay | Admitting: Orthopaedic Surgery

## 2011-07-04 ENCOUNTER — Encounter (HOSPITAL_COMMUNITY): Payer: Self-pay | Admitting: Pharmacy Technician

## 2011-07-06 ENCOUNTER — Encounter (HOSPITAL_COMMUNITY): Payer: Self-pay

## 2011-07-06 ENCOUNTER — Encounter (HOSPITAL_COMMUNITY)
Admission: RE | Admit: 2011-07-06 | Discharge: 2011-07-06 | Disposition: A | Discharge status: Home / Self Care | Payer: Medicare Other | Source: Ambulatory Visit | Attending: Orthopaedic Surgery | Admitting: Orthopaedic Surgery

## 2011-07-06 HISTORY — DX: Nonrheumatic aortic (valve) insufficiency: I35.1

## 2011-07-06 HISTORY — DX: Adverse effect of unspecified anesthetic, initial encounter: T41.45XA

## 2011-07-06 HISTORY — DX: Occlusion and stenosis of unspecified carotid artery: I65.29

## 2011-07-06 HISTORY — DX: Dyskinesia of esophagus: K22.4

## 2011-07-06 HISTORY — DX: Secondary hyperparathyroidism of renal origin: N25.81

## 2011-07-06 HISTORY — DX: Calculus of kidney: N20.0

## 2011-07-06 HISTORY — DX: Unspecified osteoarthritis, unspecified site: M19.90

## 2011-07-06 HISTORY — DX: Sleep apnea, unspecified: G47.30

## 2011-07-06 HISTORY — DX: Other complications of anesthesia, initial encounter: T88.59XA

## 2011-07-06 HISTORY — DX: Pneumonia, unspecified organism: J18.9

## 2011-07-06 HISTORY — DX: Peritonitis, unspecified: K65.9

## 2011-07-06 HISTORY — DX: Nonrheumatic aortic (valve) stenosis: I35.0

## 2011-07-06 HISTORY — DX: Heart failure, unspecified: I50.9

## 2011-07-06 HISTORY — DX: Dependence on renal dialysis: Z99.2

## 2011-07-06 LAB — SURGICAL PCR SCREEN
MRSA, PCR: NEGATIVE
Staphylococcus aureus: NEGATIVE

## 2011-07-06 NOTE — Patient Instructions (Signed)
20 Casey [name]  07/06/2011   Your procedure is scheduled on:  07/08/11 300pm-400pm  Report to [facility] at 1230pm.  Call this number if you have problems the morning of surgery: [phone]   Remember:   Do not eat food:After Midnight.  May have clear liquids:until 0830am then npo.    Clear liquids include soda, tea, black coffee, apple or grape juice, broth.  Take these medicines the morning of surgery with A SIP OF WATER:   Do not wear jewelry,.  Do not wear lotions, powders, or perfumes.  .  Do not bring valuables to the hospital.  Contacts, dentures or bridgework may not be worn into surgery.     Patients discharged the day of surgery will not be allowed to drive home.  Name and phone number of your driver  Special Instructions: CHG Shower Use Special Wash: 1/2 bottle night before surgery and 1/2 bottle morning of surgery. shower chin to toes with CHG.  Wash face and private parts with regular soap.     Please read over the following fact sheets that you were given: MRSA Information, coughing and deep breathing exercises, leg exercises

## 2011-07-06 NOTE — Pre-Procedure Instructions (Signed)
11/12 Stress Test EPIC  LOV with Pulmonary - 04/27/11 EPIC 04/15/11 LOV with Cardiologist( Dr Shirlee Latch )  Sleep Study 05/24/11 EPIC  ECHO 04/25/11 EPIC  EKG 01/19/11 EPIC  CXR 03/04/11 EPIC

## 2011-07-06 NOTE — Pre-Procedure Instructions (Signed)
07/06/11 Noted on office visit note of Dr Shirlee Latch pt prescribed fosinopril 10 mg daily per note.  Called wife at home to verify medications again.  Wife reviewed all  pill bottles and verified 10 mg fosinopril.  Metoprolol was changed to 25 mg in evening once per day.  Reviewed medications again with wife. Instructed wife to not give pt Toprol in the am of surgery  since pt takes in evening.  Wife voiced understanding.

## 2011-07-07 ENCOUNTER — Encounter: Payer: Self-pay | Admitting: Cardiology

## 2011-07-07 NOTE — Pre-Procedure Instructions (Signed)
07/07/11 Spoke with pt's wife . Wife stated she plans not to give him any meds prior surgery on am of 07/08/11.

## 2011-07-08 ENCOUNTER — Encounter (HOSPITAL_COMMUNITY): Payer: Self-pay | Admitting: Anesthesiology

## 2011-07-08 ENCOUNTER — Encounter (HOSPITAL_COMMUNITY)
Admission: RE | Disposition: A | Discharge status: Home / Self Care | Payer: Self-pay | Source: Ambulatory Visit | Attending: Orthopaedic Surgery

## 2011-07-08 ENCOUNTER — Ambulatory Visit (HOSPITAL_COMMUNITY)
Admission: RE | Admit: 2011-07-08 | Discharge: 2011-07-08 | Disposition: A | Discharge status: Home / Self Care | Payer: Medicare Other | Source: Ambulatory Visit | Attending: Orthopaedic Surgery | Admitting: Orthopaedic Surgery

## 2011-07-08 ENCOUNTER — Ambulatory Visit (HOSPITAL_COMMUNITY): Payer: Medicare Other | Admitting: Anesthesiology

## 2011-07-08 ENCOUNTER — Encounter (HOSPITAL_COMMUNITY): Payer: Self-pay | Admitting: *Deleted

## 2011-07-08 DIAGNOSIS — Z01812 Encounter for preprocedural laboratory examination: Secondary | ICD-10-CM | POA: Insufficient documentation

## 2011-07-08 DIAGNOSIS — M12269 Villonodular synovitis (pigmented), unspecified knee: Secondary | ICD-10-CM | POA: Insufficient documentation

## 2011-07-08 DIAGNOSIS — M659 Synovitis and tenosynovitis, unspecified: Secondary | ICD-10-CM

## 2011-07-08 DIAGNOSIS — K219 Gastro-esophageal reflux disease without esophagitis: Secondary | ICD-10-CM | POA: Insufficient documentation

## 2011-07-08 DIAGNOSIS — Z79899 Other long term (current) drug therapy: Secondary | ICD-10-CM | POA: Insufficient documentation

## 2011-07-08 DIAGNOSIS — M25469 Effusion, unspecified knee: Secondary | ICD-10-CM | POA: Insufficient documentation

## 2011-07-08 DIAGNOSIS — G4730 Sleep apnea, unspecified: Secondary | ICD-10-CM | POA: Insufficient documentation

## 2011-07-08 DIAGNOSIS — M2240 Chondromalacia patellae, unspecified knee: Secondary | ICD-10-CM | POA: Insufficient documentation

## 2011-07-08 DIAGNOSIS — I4891 Unspecified atrial fibrillation: Secondary | ICD-10-CM | POA: Insufficient documentation

## 2011-07-08 DIAGNOSIS — N186 End stage renal disease: Secondary | ICD-10-CM | POA: Insufficient documentation

## 2011-07-08 DIAGNOSIS — I120 Hypertensive chronic kidney disease with stage 5 chronic kidney disease or end stage renal disease: Secondary | ICD-10-CM | POA: Insufficient documentation

## 2011-07-08 HISTORY — PX: KNEE ARTHROSCOPY: SHX127

## 2011-07-08 HISTORY — PX: SYNOVECTOMY: SHX5180

## 2011-07-08 LAB — BASIC METABOLIC PANEL
BUN: 64 mg/dL — ABNORMAL HIGH (ref 6–23)
CO2: 30 mEq/L (ref 19–32)
Chloride: 94 mEq/L — ABNORMAL LOW (ref 96–112)
Creatinine, Ser: 4.48 mg/dL — ABNORMAL HIGH (ref 0.50–1.35)
Glucose, Bld: 112 mg/dL — ABNORMAL HIGH (ref 70–99)

## 2011-07-08 LAB — CBC
HCT: 30.6 % — ABNORMAL LOW (ref 39.0–52.0)
MCHC: 31.4 g/dL (ref 30.0–36.0)
MCV: 102 fL — ABNORMAL HIGH (ref 78.0–100.0)
RDW: 17 % — ABNORMAL HIGH (ref 11.5–15.5)
WBC: 9.5 10*3/uL (ref 4.0–10.5)

## 2011-07-08 SURGERY — ARTHROSCOPY, KNEE
Anesthesia: General | Site: Knee | Laterality: Left | Wound class: Clean

## 2011-07-08 MED ORDER — ONDANSETRON HCL 4 MG/2ML IJ SOLN
INTRAMUSCULAR | Status: DC | PRN
  Administered 2011-07-08: 4 mg via INTRAVENOUS

## 2011-07-08 MED ORDER — FENTANYL CITRATE 0.05 MG/ML IJ SOLN: 25.0000 ug | INTRAMUSCULAR | Status: DC | PRN

## 2011-07-08 MED ORDER — FENTANYL CITRATE 0.05 MG/ML IJ SOLN
INTRAMUSCULAR | Status: DC | PRN
  Administered 2011-07-08 (×3): 50 ug via INTRAVENOUS

## 2011-07-08 MED ORDER — BUPIVACAINE HCL (PF) 0.5 % IJ SOLN
INTRAMUSCULAR | Status: DC | PRN
  Administered 2011-07-08: 20 mL

## 2011-07-08 MED ORDER — SODIUM CHLORIDE 0.9 % IV SOLN
INTRAVENOUS | Status: DC
  Administered 2011-07-08: 1000 mL via INTRAVENOUS

## 2011-07-08 MED ORDER — CEFAZOLIN SODIUM-DEXTROSE 2-3 GM-% IV SOLR
INTRAVENOUS | Status: AC
  Filled 2011-07-08: qty 50

## 2011-07-08 MED ORDER — OXYCODONE-ACETAMINOPHEN 5-325 MG PO TABS: 1.0000 | ORAL_TABLET | ORAL | Status: DC | PRN

## 2011-07-08 MED ORDER — PROPOFOL 10 MG/ML IV BOLUS
INTRAVENOUS | Status: DC | PRN
  Administered 2011-07-08: 60 mg via INTRAVENOUS

## 2011-07-08 MED ORDER — LACTATED RINGERS IR SOLN
Status: DC | PRN
  Administered 2011-07-08: 6000 mL

## 2011-07-08 MED ORDER — BUPIVACAINE HCL (PF) 0.5 % IJ SOLN
INTRAMUSCULAR | Status: AC
  Filled 2011-07-08: qty 30

## 2011-07-08 MED ORDER — MORPHINE SULFATE 4 MG/ML IJ SOLN
INTRAMUSCULAR | Status: AC
  Filled 2011-07-08: qty 1

## 2011-07-08 MED ORDER — ETOMIDATE 2 MG/ML IV SOLN
INTRAVENOUS | Status: DC | PRN
  Administered 2011-07-08: 16 mg via INTRAVENOUS

## 2011-07-08 MED ORDER — MORPHINE SULFATE 4 MG/ML IJ SOLN
INTRAMUSCULAR | Status: DC | PRN
  Administered 2011-07-08: 4 mg

## 2011-07-08 MED ORDER — CEFAZOLIN SODIUM-DEXTROSE 2-3 GM-% IV SOLR
2.0000 g | INTRAVENOUS | Status: AC
  Administered 2011-07-08: 2 g via INTRAVENOUS

## 2011-07-08 SURGICAL SUPPLY — 30 items
BANDAGE ELASTIC 6 VELCRO ST LF (GAUZE/BANDAGES/DRESSINGS) ×2 IMPLANT
BLADE CUDA SHAVER 3.5 (BLADE) ×2 IMPLANT
CLOTH BEACON ORANGE TIMEOUT ST (SAFETY) ×2 IMPLANT
CUFF TOURN SGL QUICK 34 (TOURNIQUET CUFF) ×1
CUFF TRNQT CYL 34X4X40X1 (TOURNIQUET CUFF) ×1 IMPLANT
DRAPE U-SHAPE 47X51 STRL (DRAPES) ×2 IMPLANT
DRSG PAD ABDOMINAL 8X10 ST (GAUZE/BANDAGES/DRESSINGS) ×2 IMPLANT
DURAPREP 26ML APPLICATOR (WOUND CARE) ×2 IMPLANT
GAUZE XEROFORM 1X8 LF (GAUZE/BANDAGES/DRESSINGS) ×2 IMPLANT
GAUZE XEROFORM 4X4 STRL (GAUZE/BANDAGES/DRESSINGS) ×2 IMPLANT
GLOVE BIOGEL PI IND STRL 8 (GLOVE) ×1 IMPLANT
GLOVE BIOGEL PI INDICATOR 8 (GLOVE) ×1
GLOVE ORTHO TXT STRL SZ7.5 (GLOVE) ×2 IMPLANT
GOWN STRL REIN XL XLG (GOWN DISPOSABLE) ×2 IMPLANT
IV LACTATED RINGER IRRG 3000ML (IV SOLUTION) ×2
IV LR IRRIG 3000ML ARTHROMATIC (IV SOLUTION) ×2 IMPLANT
MANIFOLD NEPTUNE II (INSTRUMENTS) ×2 IMPLANT
PACK ARTHROSCOPY WL (CUSTOM PROCEDURE TRAY) ×2 IMPLANT
PADDING CAST ABS 6INX4YD NS (CAST SUPPLIES) ×1
PADDING CAST ABS COTTON 6X4 NS (CAST SUPPLIES) ×1 IMPLANT
PADDING CAST COTTON 6X4 STRL (CAST SUPPLIES) ×2 IMPLANT
POSITIONER SURGICAL ARM (MISCELLANEOUS) ×4 IMPLANT
SPONGE GAUZE 4X4 12PLY (GAUZE/BANDAGES/DRESSINGS) ×2 IMPLANT
SUT ETHILON 4 0 PS 2 18 (SUTURE) ×2 IMPLANT
SYR 20CC LL (SYRINGE) ×2 IMPLANT
SYR CONTROL 10ML LL (SYRINGE) ×2 IMPLANT
TOWEL OR 17X26 10 PK STRL BLUE (TOWEL DISPOSABLE) ×4 IMPLANT
TUBING CONNECTING 10 (TUBING) ×2 IMPLANT
WAND 90 DEG TURBOVAC W/CORD (SURGICAL WAND) IMPLANT
WRAP KNEE MAXI GEL POST OP (GAUZE/BANDAGES/DRESSINGS) ×2 IMPLANT

## 2011-07-08 NOTE — Preoperative (Signed)
Beta Blockers   Reason not to administer Beta Blockers:Not Applicable 

## 2011-07-08 NOTE — Anesthesia Preprocedure Evaluation (Signed)
Anesthesia Evaluation  Patient identified by MRN, date of birth, ID band Patient awake  General Assessment Comment:Multiple co-morbidities Increased CV risk  Reviewed: Allergy & Precautions, H&P , NPO status , Patient's Chart, lab work & pertinent test results, reviewed documented beta blocker date and time   Airway Mallampati: II TM Distance: >3 FB Neck ROM: Full    Dental  (+) Dental Advisory Given and Poor Dentition   Pulmonary sleep apnea and Continuous Positive Airway Pressure Ventilation ,  breath sounds clear to auscultation  + decreased breath sounds      Cardiovascular hypertension, Pt. on medications + CAD and +CHF + dysrhythmias Atrial Fibrillation Rhythm:Regular Rate:Normal  3/10 cath, 2 vessel dz, 30-40% range. Medical management. EF 50% Currently asymptomatic Mild AS, mild to mod AI Hx PAT Hx CHF, now compensated Carotid artery dz    Neuro/Psych negative neurological ROS  negative psych ROS   GI/Hepatic negative GI ROS, Neg liver ROS,   Endo/Other  negative endocrine ROS  Renal/GU CRF-dialysis dependent Tu-Th-Sat  negative genitourinary   Musculoskeletal   Abdominal   Peds negative pediatric ROS (+)  Hematology negative hematology ROS (+) Anemia, Hgb 9.6   Anesthesia Other Findings Front caps  Reproductive/Obstetrics negative OB ROS                           Anesthesia Physical Anesthesia Plan  ASA: III  Anesthesia Plan: General   Post-op Pain Management:    Induction: Intravenous  Airway Management Planned: LMA  Additional Equipment:   Intra-op Plan:   Post-operative Plan: Extubation in OR  Informed Consent: I have reviewed the patients History and Physical, chart, labs and discussed the procedure including the risks, benefits and alternatives for the proposed anesthesia with the patient or authorized representative who has indicated his/her understanding and  acceptance.   Dental advisory given  Plan Discussed with: CRNA and Surgeon  Anesthesia Plan Comments:         Anesthesia Quick Evaluation

## 2011-07-08 NOTE — H&P (Signed)
  I have seen and examined the patient.  There has been no significant change in his health status.  He wishes to proceed with a left knee arthroscopy and understands fully the risks and benefits involved.

## 2011-07-08 NOTE — Anesthesia Postprocedure Evaluation (Signed)
  Anesthesia Post-op Note  Patient: Casey [name]  Procedure(s) Performed: Procedure(s) (LRB): ARTHROSCOPY KNEE (Left) SYNOVECTOMY (Left)  Patient Location: PACU  Anesthesia Type: General  Level of Consciousness: oriented and sedated  Airway and Oxygen Therapy: Patient Spontanous Breathing and Patient connected to nasal cannula oxygen  Post-op Pain: mild  Post-op Assessment: Post-op Vital signs reviewed, Patient's Cardiovascular Status Stable and Patent Airway  Post-op Vital Signs: stable  Complications: No apparent anesthesia complications

## 2011-07-08 NOTE — H&P (Signed)
Casey [name] is an 78 y.o. male.   Chief Complaint:   Left knee pain with recurrent effusions HPI:   78 yo male with severe synovitis left knee.  He has had multiple recurrent effusions that have not responded to aspirations followed by steroid injections.  We plan to proceed to the OR for a left knee arthroscopic intervention.  He understands fully the risks and benefits of surgery and wishes to proceed.  Past Medical History  Diagnosis Date  . Atrial fibrillation   . HTN (hypertension)   . GERD (gastroesophageal reflux disease)   . Depression   . Microcytic anemia   . Leukocytosis   . Hyponatremia   . Complication of anesthesia     hypotension after colonoscopy   . Shortness of breath     when walking   . Sleep apnea     new diagnosis CPAP since 1/13 settings 10   . Arthritis   . CHF (congestive heart failure)     diastolic heart failure   . Carotid artery stenosis     RICA 40-59% per OV note of Dr Shirlee Latch 2/13   . Pneumonia     adm 10/12 wtih pneumonia   . Aortic stenosis     mild  . Aortic insufficiency     mild to moderate per note 2/13 of cardiologist   . Peritonitis     hx of with peritoneal dialysis 2009  . Hyperparathyroidism, secondary   . ESRD (end stage renal disease)     dialysis x 8 yrs   . Nephrolithiasis     hx of per office note of 2/13   . Esophageal dysmotility   . Dialysis patient     Left upper arm fistula, goes to [facility] in [place]     Past Surgical History  Procedure Date  . Other surgical history     left upper arm fistula  on peritoneal dialysis for first four years   . Other surgical history     surgery for peritoneal dialysis     Family History  Problem Relation Age of Onset  . Stroke    . Hypertension     Social History:  reports that he has never smoked. He has never used smokeless tobacco. He reports that he does not drink alcohol or use illicit drugs.  Allergies: No Known Allergies  Medications Prior to Admission    Medication Sig Dispense Refill  . aspirin EC 81 MG tablet Take 81 mg by mouth every evening.      Marland Kitchen b complex-vitamin c-folic acid (NEPHRO-VITE) 0.8 MG TABS Take 0.8 mg by mouth at bedtime.       . calcium citrate-vitamin D (CITRACAL+D) 315-200 MG-UNIT per tablet Take 1 tablet by mouth 2 (two) times daily. Takes one with each meal      . diphenhydrAMINE (BENADRYL) 25 MG tablet Take 25 mg by mouth daily as needed. For allergies      . fosinopril (MONOPRIL) 20 MG tablet Take 10 mg by mouth every evening.       . megestrol (MEGACE) 40 MG tablet Take 40 mg by mouth daily with breakfast.       . metoprolol succinate (TOPROL-XL) 25 MG 24 hr tablet Take 25 mg by mouth daily. Takes in evening      . omeprazole (PRILOSEC) 20 MG capsule Take 40 mg by mouth daily with breakfast.       . oxycodone (OXY-IR) 5 MG capsule Take 5 mg by  mouth every 6 (six) hours as needed.      Marland Kitchen oxyCODONE-acetaminophen (PERCOCET) 5-325 MG per tablet Take 1-2 tablets by mouth every 4 (four) hours as needed. For pain       . PARoxetine (PAXIL) 30 MG tablet Take 30 mg by mouth every evening.       . pravastatin (PRAVACHOL) 20 MG tablet Take 1 tablet (20 mg total) by mouth every evening.  90 tablet  4  . Probiotic Product (PROBIOTIC PO) Take 1 tablet by mouth daily with breakfast.       . warfarin (COUMADIN) 5 MG tablet Take 2.5-5 mg by mouth every evening. Take 0.5 tablet on Mon and Fri, take 1 tablet the rest of the days.        Results for orders placed during the hospital encounter of 07/06/11 (from the past 48 hour(s))  SURGICAL PCR SCREEN     Status: Normal   Collection Time   07/06/11  1:24 PM      Component Value Range Comment   MRSA, PCR NEGATIVE  NEGATIVE     Staphylococcus aureus NEGATIVE  NEGATIVE     No results found.  Review of Systems  All other systems reviewed and are negative.    There were no vitals taken for this visit. Physical Exam  Constitutional: He is oriented to person, place, and time. He  appears well-developed and well-nourished.  HENT:  Head: Normocephalic and atraumatic.  Eyes: EOM are normal. Pupils are equal, round, and reactive to light.  Neck: Normal range of motion. Neck supple.  Cardiovascular: Normal rate and regular rhythm.   Respiratory: Effort normal and breath sounds normal.  GI: Soft. Bowel sounds are normal.  Musculoskeletal:       Left knee: He exhibits swelling and effusion. tenderness found.  Neurological: He is alert and oriented to person, place, and time.  Skin: Skin is warm and dry.  Psychiatric: He has a normal mood and affect.     Assessment/Plan To the OR for a left knee arthroscopy.  Kathryne Hitch 07/08/2011, 12:10 PM

## 2011-07-08 NOTE — Brief Op Note (Signed)
07/08/2011  4:01 PM  PATIENT:  Delsa Grana Delmont  78 y.o. male  PRE-OPERATIVE DIAGNOSIS:  Severe synovitis and arthritis of left knee with recurrent effusion  POST-OPERATIVE DIAGNOSIS:  Severe synovitis and arthritis of left knee with recurrent effusion  PROCEDURE:  Procedure(s) (LRB): ARTHROSCOPY KNEE (Left) SYNOVECTOMY (Left)  SURGEON:  Surgeon(s) and Role:    * Kathryne Hitch, MD - Primary  PHYSICIAN ASSISTANT:   ASSISTANTS: none   ANESTHESIA:   local and general  EBL:  Total I/O In: 200 [I.V.:200] Out: -   BLOOD ADMINISTERED:none  DRAINS: none   LOCAL MEDICATIONS USED:  MARCAINE     SPECIMEN:  No Specimen  DISPOSITION OF SPECIMEN:  N/A  COUNTS:  YES  TOURNIQUET:  * No tourniquets in log *  DICTATION: .Other Dictation: Dictation Number [id number]  PLAN OF CARE: Discharge to home after PACU  PATIENT DISPOSITION:  PACU - hemodynamically stable.   Delay start of Pharmacological VTE agent (>24hrs) due to surgical blood loss or risk of bleeding: not applicable

## 2011-07-08 NOTE — Transfer of Care (Signed)
Immediate Anesthesia Transfer of Care Note  Patient: Casey [name]  Procedure(s) Performed: Procedure(s) (LRB): ARTHROSCOPY KNEE (Left) SYNOVECTOMY (Left)  Patient Location: PACU  Anesthesia Type: General  Level of Consciousness: awake, alert , oriented and patient cooperative  Airway & Oxygen Therapy: Patient Spontanous Breathing and Patient connected to face mask oxygen  Post-op Assessment: Report given to PACU RN and Post -op Vital signs reviewed and stable  Post vital signs: Reviewed and stable  Complications: No apparent anesthesia complications

## 2011-07-09 NOTE — Op Note (Signed)
NAMEAMRO, WINEBARGER NO.:  [id number]  MEDICAL RECORD NO.:  [id number]  LOCATION:  [facility]                         FACILITY:  [facility]  PHYSICIAN:  Vanita Panda. Magnus Ivan, M.D.DATE OF BIRTH:  Apr 29, 1931  DATE OF PROCEDURE:  07/08/2011 DATE OF DISCHARGE:  07/08/2011                              OPERATIVE REPORT   PREOPERATIVE DIAGNOSIS:  Recurrent effusions, left knee with severe synovitis.  POSTOPERATIVE DIAGNOSES: 1. Grade 4 chondromalacia; medial compartment, lateral compartment,     and patellofemoral joint. 2. Pigmented villonodular synovitis.  PROCEDURES PERFORMED:  Left knee arthroscopy with extensive debridement and partial synovectomy.  SURGEON:  Vanita Panda. Magnus Ivan, M.D.  ANESTHESIA: 1. General. 2. Local with 4 mg and morphine mixed with 15 cc of 0.5% plain     Sensorcaine.  BLOOD LOSS:  Minimal.  COMPLICATIONS:  None.  INDICATIONS:  Mr. Rivest is a 78 year old gentleman on dialysis, who has had recurrent effusions and pain in his right knee x-rays.  He still had well-maintained joint space with effusion recurrent.  We drained it multiple times and placed multiple steroid injections in the knee, but he continued to have recurrent effusion and no evidence of infection, although he had a high white count within the knee between 15,000 and 30,000.  At this point, we recommended he undergo a partial synovectomy as much as possible to clean out the knee.  He now wished to proceed with the knee replacement.  I agree with this as well due to his immunocompromised position of being on dialysis.  DESCRIPTION OF THE PROCEDURE:  After informed consent was obtained, appropriate left knee was marked.  He was brought to the operating room, placed supine on the operating table.  General anesthesia was then obtained.  His left leg was prepped and draped from the thigh down the ankle with DuraPrep and sterile drapes including sterile stockinette with  the bed raised, and a lateral leg post utilized.  The left knee was flexed off the side of the table.  A time-out was called and he was identified as the correct patient, correct left knee.  I then made an anterolateral arthroscopy portal, inserted a cannula in the knee, and drained a very large effusion from the knee.  I then went to the medial compartment and made an anteromedial incision where the cannula was inserted in the knee, and I found hemosiderin-like appearance of pigmented villonodular synovitis throughout the knee.  Placing an arthroscopic shaver, I spent a meticulous amount of time going around the synovium and removing as much of this tissues that I could.  I did find that his medial and lateral compartments both had broad areas of full-thickness cartilage loss with degenerative meniscal tearing.  I used an arthroscopic shaver to extensively ream back to stable margins as much as possible; however, it does look like he is going to be in need of a knee replacement.  Once in the patellofemoral joint, I spent a lot of time cleaning off the synovium.  I allowed several liters of fluid to lavage through the knee.  I then drained the fluid from the knee and closed the portal sites with interrupted nylon sutures.  Xeroform followed by well-padded sterile  dressing was applied.  The patient was awakened, extubated, and taken to recovery room in stable condition.  Postoperatively, discharged to home with followup in the office in a week.     Vanita Panda. Magnus Ivan, M.D.     CYB/MEDQ  D:  07/08/2011  T:  07/09/2011  Job:  [id number]

## 2011-07-11 ENCOUNTER — Encounter (HOSPITAL_COMMUNITY): Payer: Self-pay | Admitting: Orthopaedic Surgery

## 2011-07-11 ENCOUNTER — Telehealth: Payer: Self-pay | Admitting: Pulmonary Disease

## 2011-07-11 NOTE — Telephone Encounter (Signed)
Done, and put in box to be faxed.

## 2011-07-11 NOTE — Telephone Encounter (Signed)
Paperwork from [facility] is a Engineer, drilling and it has a sheet that KC needs to sign so we can fax back to [facility].  Paperwork is in [employer].

## 2011-07-11 NOTE — Telephone Encounter (Signed)
Called, spoke with pt's wife, Harriett Sine, who states [facility] sent a fax on 4/11 for Dr. Shelle Iron to fill out regarding coverage of pt's cpap.  She spoke with [facility] who stated they are still waiting on this to be signed.  I spoke with Alida to see if she has any forms on pt but she does not.    Megan, does Dr. Shelle Iron have any forms to be signed on this pt?

## 2011-07-12 NOTE — Telephone Encounter (Signed)
Called, spoke with pt's spouse.  She is aware form has been completed by Dr. Shelle Iron.  She verbalized understanding of this and voiced no further questions/concerns at this time.

## 2011-07-13 ENCOUNTER — Ambulatory Visit: Payer: Self-pay | Admitting: Cardiology

## 2011-07-13 DIAGNOSIS — I4891 Unspecified atrial fibrillation: Secondary | ICD-10-CM

## 2011-07-13 DIAGNOSIS — Z7901 Long term (current) use of anticoagulants: Secondary | ICD-10-CM

## 2011-07-18 ENCOUNTER — Encounter: Payer: Self-pay | Admitting: Cardiology

## 2011-07-18 DIAGNOSIS — I4891 Unspecified atrial fibrillation: Secondary | ICD-10-CM

## 2011-07-18 DIAGNOSIS — Z7901 Long term (current) use of anticoagulants: Secondary | ICD-10-CM

## 2011-07-18 NOTE — Progress Notes (Signed)
This encounter was created in error - please disregard.

## 2011-07-25 ENCOUNTER — Telehealth: Payer: Self-pay | Admitting: *Deleted

## 2011-07-25 NOTE — Telephone Encounter (Signed)
Talked with Casey [name] at HD and she states they did not do INR on 07/21/2011 Instructed to obtain on 07/26/2011

## 2011-07-26 LAB — PROTIME-INR: INR: 2.9 — AB (ref 0.9–1.1)

## 2011-07-28 ENCOUNTER — Ambulatory Visit: Payer: Self-pay | Admitting: Internal Medicine

## 2011-07-28 DIAGNOSIS — Z7901 Long term (current) use of anticoagulants: Secondary | ICD-10-CM

## 2011-07-28 DIAGNOSIS — I4891 Unspecified atrial fibrillation: Secondary | ICD-10-CM

## 2011-08-10 ENCOUNTER — Encounter (HOSPITAL_COMMUNITY): Payer: Self-pay | Admitting: Emergency Medicine

## 2011-08-10 ENCOUNTER — Other Ambulatory Visit: Payer: Self-pay

## 2011-08-10 ENCOUNTER — Emergency Department (HOSPITAL_COMMUNITY): Payer: Medicare Other

## 2011-08-10 ENCOUNTER — Inpatient Hospital Stay (HOSPITAL_COMMUNITY)
Admission: EM | Admit: 2011-08-10 | Discharge: 2011-08-16 | DRG: 682 | Payer: Medicare Other | Attending: Internal Medicine | Admitting: Internal Medicine

## 2011-08-10 DIAGNOSIS — N2581 Secondary hyperparathyroidism of renal origin: Secondary | ICD-10-CM | POA: Diagnosis present

## 2011-08-10 DIAGNOSIS — R0602 Shortness of breath: Secondary | ICD-10-CM

## 2011-08-10 DIAGNOSIS — I70219 Atherosclerosis of native arteries of extremities with intermittent claudication, unspecified extremity: Secondary | ICD-10-CM

## 2011-08-10 DIAGNOSIS — I4891 Unspecified atrial fibrillation: Secondary | ICD-10-CM

## 2011-08-10 DIAGNOSIS — I951 Orthostatic hypotension: Secondary | ICD-10-CM

## 2011-08-10 DIAGNOSIS — R195 Other fecal abnormalities: Secondary | ICD-10-CM

## 2011-08-10 DIAGNOSIS — E785 Hyperlipidemia, unspecified: Secondary | ICD-10-CM

## 2011-08-10 DIAGNOSIS — Z992 Dependence on renal dialysis: Secondary | ICD-10-CM

## 2011-08-10 DIAGNOSIS — I359 Nonrheumatic aortic valve disorder, unspecified: Secondary | ICD-10-CM

## 2011-08-10 DIAGNOSIS — IMO0002 Reserved for concepts with insufficient information to code with codable children: Secondary | ICD-10-CM | POA: Diagnosis present

## 2011-08-10 DIAGNOSIS — I509 Heart failure, unspecified: Secondary | ICD-10-CM | POA: Diagnosis present

## 2011-08-10 DIAGNOSIS — K224 Dyskinesia of esophagus: Secondary | ICD-10-CM

## 2011-08-10 DIAGNOSIS — I10 Essential (primary) hypertension: Secondary | ICD-10-CM

## 2011-08-10 DIAGNOSIS — I2510 Atherosclerotic heart disease of native coronary artery without angina pectoris: Secondary | ICD-10-CM

## 2011-08-10 DIAGNOSIS — M659 Unspecified synovitis and tenosynovitis, unspecified site: Secondary | ICD-10-CM | POA: Diagnosis present

## 2011-08-10 DIAGNOSIS — E875 Hyperkalemia: Secondary | ICD-10-CM | POA: Diagnosis present

## 2011-08-10 DIAGNOSIS — I350 Nonrheumatic aortic (valve) stenosis: Secondary | ICD-10-CM

## 2011-08-10 DIAGNOSIS — D649 Anemia, unspecified: Secondary | ICD-10-CM

## 2011-08-10 DIAGNOSIS — N19 Unspecified kidney failure: Secondary | ICD-10-CM | POA: Diagnosis present

## 2011-08-10 DIAGNOSIS — M1710 Unilateral primary osteoarthritis, unspecified knee: Secondary | ICD-10-CM | POA: Diagnosis present

## 2011-08-10 DIAGNOSIS — J811 Chronic pulmonary edema: Secondary | ICD-10-CM

## 2011-08-10 DIAGNOSIS — R630 Anorexia: Secondary | ICD-10-CM | POA: Diagnosis present

## 2011-08-10 DIAGNOSIS — F411 Generalized anxiety disorder: Secondary | ICD-10-CM | POA: Diagnosis present

## 2011-08-10 DIAGNOSIS — Z7901 Long term (current) use of anticoagulants: Secondary | ICD-10-CM

## 2011-08-10 DIAGNOSIS — I5032 Chronic diastolic (congestive) heart failure: Secondary | ICD-10-CM

## 2011-08-10 DIAGNOSIS — I6529 Occlusion and stenosis of unspecified carotid artery: Secondary | ICD-10-CM

## 2011-08-10 DIAGNOSIS — M65969 Unspecified synovitis and tenosynovitis, unspecified lower leg: Secondary | ICD-10-CM

## 2011-08-10 DIAGNOSIS — R627 Adult failure to thrive: Secondary | ICD-10-CM | POA: Diagnosis present

## 2011-08-10 DIAGNOSIS — G4733 Obstructive sleep apnea (adult) (pediatric): Secondary | ICD-10-CM

## 2011-08-10 DIAGNOSIS — I5022 Chronic systolic (congestive) heart failure: Secondary | ICD-10-CM

## 2011-08-10 DIAGNOSIS — M25462 Effusion, left knee: Secondary | ICD-10-CM

## 2011-08-10 DIAGNOSIS — N186 End stage renal disease: Principal | ICD-10-CM

## 2011-08-10 DIAGNOSIS — G9340 Encephalopathy, unspecified: Secondary | ICD-10-CM

## 2011-08-10 DIAGNOSIS — D72829 Elevated white blood cell count, unspecified: Secondary | ICD-10-CM | POA: Diagnosis present

## 2011-08-10 DIAGNOSIS — Z515 Encounter for palliative care: Secondary | ICD-10-CM

## 2011-08-10 DIAGNOSIS — M25469 Effusion, unspecified knee: Secondary | ICD-10-CM | POA: Diagnosis present

## 2011-08-10 HISTORY — DX: Atherosclerotic heart disease of native coronary artery without angina pectoris: I25.10

## 2011-08-10 HISTORY — DX: Peripheral vascular disease, unspecified: I73.9

## 2011-08-10 HISTORY — DX: Other specified disorders of kidney and ureter: N28.89

## 2011-08-10 HISTORY — DX: Paroxysmal atrial fibrillation: I48.0

## 2011-08-10 HISTORY — DX: Disorder of arteries and arterioles, unspecified: I77.9

## 2011-08-10 LAB — DIFFERENTIAL
Basophils Absolute: 0 10*3/uL (ref 0.0–0.1)
Basophils Relative: 0 % (ref 0–1)
Eosinophils Relative: 2 % (ref 0–5)
Monocytes Absolute: 1.6 10*3/uL — ABNORMAL HIGH (ref 0.1–1.0)

## 2011-08-10 LAB — CBC
HCT: 30.1 % — ABNORMAL LOW (ref 39.0–52.0)
MCHC: 32.2 g/dL (ref 30.0–36.0)
MCV: 100.3 fL — ABNORMAL HIGH (ref 78.0–100.0)
Platelets: 257 10*3/uL (ref 150–400)
RDW: 18.5 % — ABNORMAL HIGH (ref 11.5–15.5)
WBC: 12.6 10*3/uL — ABNORMAL HIGH (ref 4.0–10.5)

## 2011-08-10 LAB — COMPREHENSIVE METABOLIC PANEL
ALT: 19 U/L (ref 0–53)
AST: 20 U/L (ref 0–37)
Albumin: 3 g/dL — ABNORMAL LOW (ref 3.5–5.2)
Calcium: 10.6 mg/dL — ABNORMAL HIGH (ref 8.4–10.5)
Creatinine, Ser: 7.05 mg/dL — ABNORMAL HIGH (ref 0.50–1.35)
Sodium: 129 mEq/L — ABNORMAL LOW (ref 135–145)
Total Protein: 7.3 g/dL (ref 6.0–8.3)

## 2011-08-10 LAB — PROTIME-INR: INR: 3.47 — ABNORMAL HIGH (ref 0.00–1.49)

## 2011-08-10 MED ORDER — SODIUM POLYSTYRENE SULFONATE 15 GM/60ML PO SUSP
30.0000 g | Freq: Once | ORAL | Status: AC
  Administered 2011-08-11: 30 g via ORAL
  Filled 2011-08-10: qty 120

## 2011-08-10 MED ORDER — SODIUM BICARBONATE 8.4 % IV SOLN
25.0000 meq | Freq: Once | INTRAVENOUS | Status: AC
  Administered 2011-08-11: 25 meq via INTRAVENOUS
  Filled 2011-08-10: qty 50

## 2011-08-10 MED ORDER — INSULIN ASPART 100 UNIT/ML ~~LOC~~ SOLN
8.0000 [IU] | Freq: Once | SUBCUTANEOUS | Status: AC
  Administered 2011-08-11: 8 [IU] via INTRAVENOUS
  Filled 2011-08-10: qty 1

## 2011-08-10 MED ORDER — CALCIUM GLUCONATE 10 % IV SOLN
1.0000 g | Freq: Once | INTRAVENOUS | Status: AC
  Administered 2011-08-11: 1 g via INTRAVENOUS
  Filled 2011-08-10: qty 10

## 2011-08-10 MED ORDER — DEXTROSE 50 % IV SOLN
50.0000 mL | Freq: Once | INTRAVENOUS | Status: AC
  Administered 2011-08-11: 50 mL via INTRAVENOUS
  Filled 2011-08-10: qty 50

## 2011-08-10 MED ORDER — ONDANSETRON HCL 4 MG/2ML IJ SOLN
4.0000 mg | Freq: Once | INTRAMUSCULAR | Status: AC
  Administered 2011-08-11: 4 mg via INTRAVENOUS
  Filled 2011-08-10: qty 2

## 2011-08-10 MED ORDER — HYDROMORPHONE HCL PF 1 MG/ML IJ SOLN
1.0000 mg | Freq: Once | INTRAMUSCULAR | Status: AC
  Administered 2011-08-11: 1 mg via INTRAVENOUS
  Filled 2011-08-10: qty 1

## 2011-08-10 NOTE — ED Notes (Signed)
Patient with left knee pain and swelling that has been going on for a week.  Patient has missed his dialysis for last three sessions due to unable to walk, patient is TTH dialysis.

## 2011-08-10 NOTE — ED Notes (Signed)
MD at bedside. 

## 2011-08-11 ENCOUNTER — Emergency Department (HOSPITAL_COMMUNITY): Payer: Medicare Other

## 2011-08-11 ENCOUNTER — Encounter (HOSPITAL_COMMUNITY): Payer: Self-pay | Admitting: Physician Assistant

## 2011-08-11 DIAGNOSIS — R791 Abnormal coagulation profile: Secondary | ICD-10-CM

## 2011-08-11 DIAGNOSIS — N186 End stage renal disease: Secondary | ICD-10-CM

## 2011-08-11 DIAGNOSIS — D72829 Elevated white blood cell count, unspecified: Secondary | ICD-10-CM | POA: Diagnosis present

## 2011-08-11 DIAGNOSIS — R197 Diarrhea, unspecified: Secondary | ICD-10-CM

## 2011-08-11 DIAGNOSIS — M79609 Pain in unspecified limb: Secondary | ICD-10-CM

## 2011-08-11 DIAGNOSIS — N19 Unspecified kidney failure: Secondary | ICD-10-CM

## 2011-08-11 DIAGNOSIS — E875 Hyperkalemia: Secondary | ICD-10-CM | POA: Diagnosis present

## 2011-08-11 DIAGNOSIS — G9340 Encephalopathy, unspecified: Secondary | ICD-10-CM | POA: Diagnosis present

## 2011-08-11 DIAGNOSIS — I4891 Unspecified atrial fibrillation: Secondary | ICD-10-CM

## 2011-08-11 DIAGNOSIS — J811 Chronic pulmonary edema: Secondary | ICD-10-CM

## 2011-08-11 LAB — HEPATITIS B SURFACE ANTIGEN: Hepatitis B Surface Ag: NEGATIVE

## 2011-08-11 LAB — MRSA PCR SCREENING: MRSA by PCR: NEGATIVE

## 2011-08-11 LAB — POCT I-STAT 3, ART BLOOD GAS (G3+)
Acid-base deficit: 1 mmol/L (ref 0.0–2.0)
Bicarbonate: 24.5 mEq/L — ABNORMAL HIGH (ref 20.0–24.0)
TCO2: 26 mmol/L (ref 0–100)
pO2, Arterial: 62 mmHg — ABNORMAL LOW (ref 80.0–100.0)

## 2011-08-11 LAB — CARDIAC PANEL(CRET KIN+CKTOT+MB+TROPI): Troponin I: <0.3 ng/mL (ref <0.30)

## 2011-08-11 MED ORDER — SODIUM CHLORIDE 0.9 % IV SOLN: 250.0000 mL | INTRAVENOUS | Status: DC | PRN

## 2011-08-11 MED ORDER — LIDOCAINE 5 % EX PTCH
1.0000 | MEDICATED_PATCH | CUTANEOUS | Status: DC
  Administered 2011-08-11 – 2011-08-15 (×5): 1 via TRANSDERMAL
  Filled 2011-08-11 (×6): qty 1

## 2011-08-11 MED ORDER — HEPARIN SODIUM (PORCINE) 1000 UNIT/ML DIALYSIS
100.0000 [IU]/kg | INTRAMUSCULAR | Status: DC | PRN
  Filled 2011-08-11: qty 9

## 2011-08-11 MED ORDER — SODIUM CHLORIDE 0.9 % IV SOLN: 100.0000 mL | INTRAVENOUS | Status: DC | PRN

## 2011-08-11 MED ORDER — METOPROLOL TARTRATE 1 MG/ML IV SOLN
INTRAVENOUS | Status: AC
  Administered 2011-08-11: 06:00:00
  Filled 2011-08-11: qty 5

## 2011-08-11 MED ORDER — HEPARIN SODIUM (PORCINE) 1000 UNIT/ML DIALYSIS: 1000.0000 [IU] | INTRAMUSCULAR | Status: DC | PRN

## 2011-08-11 MED ORDER — PAROXETINE HCL 30 MG PO TABS
30.0000 mg | ORAL_TABLET | Freq: Every evening | ORAL | Status: DC
  Administered 2011-08-11: 30 mg via ORAL
  Filled 2011-08-11 (×2): qty 1

## 2011-08-11 MED ORDER — PENTAFLUOROPROP-TETRAFLUOROETH EX AERO: 1.0000 "application " | INHALATION_SPRAY | CUTANEOUS | Status: DC | PRN

## 2011-08-11 MED ORDER — NEPRO/CARBSTEADY PO LIQD
237.0000 mL | ORAL | Status: DC | PRN
  Filled 2011-08-11: qty 237

## 2011-08-11 MED ORDER — LIDOCAINE HCL (PF) 1 % IJ SOLN: 5.0000 mL | INTRAMUSCULAR | Status: DC | PRN

## 2011-08-11 MED ORDER — ONDANSETRON HCL 4 MG PO TABS: 4.0000 mg | ORAL_TABLET | Freq: Four times a day (QID) | ORAL | Status: DC | PRN

## 2011-08-11 MED ORDER — SODIUM CHLORIDE 0.9 % IJ SOLN
3.0000 mL | Freq: Two times a day (BID) | INTRAMUSCULAR | Status: DC
  Administered 2011-08-12 – 2011-08-16 (×9): 3 mL via INTRAVENOUS

## 2011-08-11 MED ORDER — ACETAMINOPHEN 325 MG PO TABS
650.0000 mg | ORAL_TABLET | Freq: Four times a day (QID) | ORAL | Status: DC | PRN
Start: 2011-08-11 — End: 2011-08-16
  Administered 2011-08-11: 650 mg via ORAL
  Filled 2011-08-11: qty 2

## 2011-08-11 MED ORDER — LIDOCAINE-PRILOCAINE 2.5-2.5 % EX CREA
1.0000 "application " | TOPICAL_CREAM | CUTANEOUS | Status: DC | PRN
  Filled 2011-08-11: qty 5

## 2011-08-11 MED ORDER — SENNA 8.6 MG PO TABS
1.0000 | ORAL_TABLET | Freq: Two times a day (BID) | ORAL | Status: DC
  Administered 2011-08-11 – 2011-08-12 (×2): 8.6 mg via ORAL
  Filled 2011-08-11 (×4): qty 1

## 2011-08-11 MED ORDER — ACETAMINOPHEN 650 MG RE SUPP: 650.0000 mg | Freq: Four times a day (QID) | RECTAL | Status: DC | PRN

## 2011-08-11 MED ORDER — HYDROMORPHONE HCL PF 1 MG/ML IJ SOLN
0.5000 mg | INTRAMUSCULAR | Status: DC | PRN
  Administered 2011-08-11 (×3): 0.5 mg via INTRAVENOUS
  Administered 2011-08-11: 2.5 mg via INTRAVENOUS
  Administered 2011-08-12: 0.5 mg via INTRAVENOUS
  Administered 2011-08-12: 0.25 mg via INTRAVENOUS
  Administered 2011-08-12 (×2): 0.5 mg via INTRAVENOUS
  Administered 2011-08-12 (×3): 0.25 mg via INTRAVENOUS
  Administered 2011-08-12 – 2011-08-13 (×5): 0.5 mg via INTRAVENOUS
  Filled 2011-08-11 (×13): qty 1

## 2011-08-11 MED ORDER — HEPARIN SODIUM (PORCINE) 1000 UNIT/ML DIALYSIS
1000.0000 [IU] | INTRAMUSCULAR | Status: DC | PRN
  Filled 2011-08-11: qty 1

## 2011-08-11 MED ORDER — SODIUM CHLORIDE 0.9 % IJ SOLN
3.0000 mL | Freq: Two times a day (BID) | INTRAMUSCULAR | Status: DC
  Administered 2011-08-11: 3 mL via INTRAVENOUS

## 2011-08-11 MED ORDER — ONDANSETRON HCL 4 MG/2ML IJ SOLN: 4.0000 mg | Freq: Four times a day (QID) | INTRAMUSCULAR | Status: DC | PRN

## 2011-08-11 MED ORDER — SODIUM CHLORIDE 0.9 % IJ SOLN: 3.0000 mL | INTRAMUSCULAR | Status: DC | PRN

## 2011-08-11 MED ORDER — ALTEPLASE 2 MG IJ SOLR
2.0000 mg | Freq: Once | INTRAMUSCULAR | Status: AC | PRN
  Filled 2011-08-11: qty 2

## 2011-08-11 MED ORDER — LISINOPRIL 20 MG PO TABS: 20.0000 mg | ORAL_TABLET | Freq: Every day | ORAL | Status: DC

## 2011-08-11 MED ORDER — ASPIRIN EC 81 MG PO TBEC
81.0000 mg | DELAYED_RELEASE_TABLET | Freq: Every evening | ORAL | Status: DC
  Filled 2011-08-11 (×2): qty 1

## 2011-08-11 MED ORDER — METOPROLOL TARTRATE 25 MG PO TABS
25.0000 mg | ORAL_TABLET | Freq: Four times a day (QID) | ORAL | Status: DC
  Administered 2011-08-11 – 2011-08-12 (×2): 25 mg via ORAL
  Filled 2011-08-11 (×6): qty 1

## 2011-08-11 MED ORDER — DEXTROSE 5 % IV SOLN
5.0000 mg/h | INTRAVENOUS | Status: DC
  Administered 2011-08-11: 15 mg/h via INTRAVENOUS
  Filled 2011-08-11: qty 100

## 2011-08-11 MED ORDER — HEPARIN SODIUM (PORCINE) 1000 UNIT/ML DIALYSIS
20.0000 [IU]/kg | INTRAMUSCULAR | Status: DC | PRN
  Filled 2011-08-11: qty 2

## 2011-08-11 MED ORDER — WARFARIN - PHARMACIST DOSING INPATIENT: Freq: Every day | Status: DC

## 2011-08-11 MED ORDER — RENA-VITE PO TABS: 1.0000 | ORAL_TABLET | Freq: Every day | ORAL | Status: DC

## 2011-08-11 MED ORDER — SIMVASTATIN 10 MG PO TABS
10.0000 mg | ORAL_TABLET | Freq: Every day | ORAL | Status: DC
  Administered 2011-08-11: 10 mg via ORAL
  Filled 2011-08-11 (×2): qty 1

## 2011-08-11 MED ORDER — AMIODARONE HCL 200 MG PO TABS
400.0000 mg | ORAL_TABLET | Freq: Two times a day (BID) | ORAL | Status: DC
  Administered 2011-08-11 – 2011-08-16 (×11): 400 mg via ORAL
  Filled 2011-08-11 (×12): qty 2

## 2011-08-11 MED ORDER — DOCUSATE SODIUM 100 MG PO CAPS
100.0000 mg | ORAL_CAPSULE | Freq: Two times a day (BID) | ORAL | Status: DC
  Administered 2011-08-11 – 2011-08-16 (×10): 100 mg via ORAL
  Filled 2011-08-11 (×12): qty 1

## 2011-08-11 MED ORDER — RENA-VITE PO TABS
1.0000 | ORAL_TABLET | Freq: Every day | ORAL | Status: DC
  Administered 2011-08-11: 1 via ORAL
  Filled 2011-08-11 (×2): qty 1

## 2011-08-11 MED ORDER — DARBEPOETIN ALFA-POLYSORBATE 100 MCG/0.5ML IJ SOLN
100.0000 ug | INTRAMUSCULAR | Status: DC
  Filled 2011-08-11: qty 0.5

## 2011-08-11 MED ORDER — METOPROLOL TARTRATE 1 MG/ML IV SOLN
INTRAVENOUS | Status: AC
  Filled 2011-08-11: qty 5

## 2011-08-11 MED ORDER — PENTAFLUOROPROP-TETRAFLUOROETH EX AERO
1.0000 "application " | INHALATION_SPRAY | CUTANEOUS | Status: DC | PRN
  Filled 2011-08-11: qty 103.5

## 2011-08-11 MED ORDER — METOPROLOL TARTRATE 1 MG/ML IV SOLN: 5.0000 mg | Freq: Four times a day (QID) | INTRAVENOUS | Status: DC

## 2011-08-11 MED ORDER — HYDROCODONE-ACETAMINOPHEN 5-325 MG PO TABS
1.0000 | ORAL_TABLET | Freq: Four times a day (QID) | ORAL | Status: DC | PRN
  Administered 2011-08-11: 1 via ORAL
  Filled 2011-08-11: qty 1

## 2011-08-11 MED ORDER — LISINOPRIL 10 MG PO TABS
10.0000 mg | ORAL_TABLET | Freq: Every day | ORAL | Status: DC
  Administered 2011-08-11: 10 mg via ORAL
  Filled 2011-08-11 (×2): qty 1

## 2011-08-11 MED ORDER — LIDOCAINE-PRILOCAINE 2.5-2.5 % EX CREA: 1.0000 "application " | TOPICAL_CREAM | CUTANEOUS | Status: DC | PRN

## 2011-08-11 MED ORDER — POLYETHYLENE GLYCOL 3350 17 G PO PACK
17.0000 g | PACK | Freq: Every day | ORAL | Status: DC | PRN
  Filled 2011-08-11: qty 1

## 2011-08-11 NOTE — Progress Notes (Signed)
Pt. Sustained a heart rate 170-180. Dr. Arrie Aran and Dr. Kaylyn Layer paged, hemodialysis treatment stopped, blood rinsed back awaiting md return call. Vital signs taken.

## 2011-08-11 NOTE — Progress Notes (Signed)
Rapid Response Nurse paged and came to pt. Bedside, Dr. Donnamarie Poag came to bedside. 12 lead ekg, lopressor 5mg  given, 5mg  cardizem bolus, 10ml cardizem drip. B/p 177/72. 0610 b/pp 185/64. Cannulation needles pulled pt. Transferred to 2914, report given to Santina Evans, Charity fundraiser.

## 2011-08-11 NOTE — H&P (Signed)
PCP:  [name], MD, MD  Not confirmable Dr. Magnus Ivan, ortho  Chief Complaint:  Left knee pain, missed HD  HPI: 80yOM with h/o ESRD on HD, aortic stenosis and insufficiency, Afib, orthostatic hypoTN, dCHF, and  chronic left knee effusions who presents with persistent left knee pain leading to missed HD sessions.   He cannot give history, as he seems quite obtunded, only able to awaken very briefly and then dozes  back off, cannot carry conversation, and no family now present. But in speaking with ED staff, it seems  pt was more conversant and alert earlier, able to converse fully but a bit off on details, and  apparently [place] also had long convo with him and family.   His story is that he has worsening left knee pain and missed HD for the past 2 sessions. Pt last saw  Dr. Magnus Ivan in ortho 06/2011 who notes multiple recurrent effusions unresponsive to aspirations and  injections. He was taken to OR for left knee arthroscopy, debridement and partial synovectomy on 4/26.  He can state that the knee is not more swollen or worse than when he had the procedure. He was brought  to the ED for increased weakness, a fall today.   In the ED, vitals stable except HTN up to 203/67. Labs with hypoNa 129, hyperK 7.0, HCO3 23, renal  156/7.05. WBC 12.6, Hgb 9.7. INR was high at 3.47. CT head negative for acute. CXR without acute  change. Left knee plain film with large left knee effusion, cannot rule out loose body. Pt was given Ca  gluconate, 50 cc dextrose, 8u novolog, 1mg  dilaudid, 4mg  zofran, Na bicarb, kayexalate.   Dr. Arrie Aran saw him and pt is currently in HD. He cannot give ROS as above, but denies there's  anything wrong. He endorses feeling tired and sleepy when I ask him what's wrong.    Past Medical History  Diagnosis Date  . Atrial fibrillation   . HTN (hypertension)   . GERD (gastroesophageal reflux disease)   . Depression   . Microcytic anemia   . Leukocytosis     . Hyponatremia   . Complication of anesthesia     hypotension after colonoscopy   . Shortness of breath     when walking   . Sleep apnea     new diagnosis CPAP since 1/13 settings 10   . Arthritis   . CHF (congestive heart failure)     diastolic heart failure   . Carotid artery stenosis     RICA 40-59% per OV note of Dr Shirlee Latch 2/13   . Pneumonia     adm 10/12 wtih pneumonia   . Aortic stenosis     mild  . Aortic insufficiency     mild to moderate per note 2/13 of cardiologist   . Peritonitis     hx of with peritoneal dialysis 2009  . Hyperparathyroidism, secondary   . ESRD (end stage renal disease)     dialysis x 8 yrs   . Nephrolithiasis     hx of per office note of 2/13   . Esophageal dysmotility   . Dialysis patient     Left upper arm fistula, goes to [facility] in [place]     Past Surgical History  Procedure Date  . Other surgical history     left upper arm fistula  on peritoneal dialysis for first four years   . Other surgical history     surgery for peritoneal dialysis   .  Knee arthroscopy 07/08/2011    Procedure: ARTHROSCOPY KNEE;  Surgeon: Kathryne Hitch, MD;  Location: [facility];  Service: Orthopedics;  Laterality: Left;  Left Knee Arthroscopy, Debridement and Synovectomy  . Synovectomy 07/08/2011    Procedure: SYNOVECTOMY;  Surgeon: Kathryne Hitch, MD;  Location: [facility];  Service: Orthopedics;  Laterality: Left;    Medications:  HOME MEDS: Not reconcilable  Prior to Admission medications   Medication Sig Start Date End Date Taking? Authorizing Provider  aspirin EC 81 MG tablet Take 81 mg by mouth every evening.    Historical Provider, MD  b complex-vitamin c-folic acid (NEPHRO-VITE) 0.8 MG TABS Take 0.8 mg by mouth at bedtime.     Historical Provider, MD  calcium citrate-vitamin D (CITRACAL+D) 315-200 MG-UNIT per tablet Take 1 tablet by mouth 2 (two) times daily. Takes one with each meal    Historical Provider, MD  diphenhydrAMINE  (BENADRYL) 25 MG tablet Take 25 mg by mouth daily as needed. For allergies    Historical Provider, MD  fosinopril (MONOPRIL) 20 MG tablet Take 10 mg by mouth every evening.     Historical Provider, MD  megestrol (MEGACE) 40 MG tablet Take 40 mg by mouth daily with breakfast.     Historical Provider, MD  metoprolol succinate (TOPROL-XL) 25 MG 24 hr tablet Take 25 mg by mouth daily. Takes in evening 04/15/11 04/14/12  Laurey Morale, MD  omeprazole (PRILOSEC) 20 MG capsule Take 40 mg by mouth daily with breakfast.     Historical Provider, MD  oxycodone (OXY-IR) 5 MG capsule Take 5 mg by mouth every 6 (six) hours as needed.    Historical Provider, MD  oxyCODONE-acetaminophen (PERCOCET) 5-325 MG per tablet Take 1-2 tablets by mouth every 4 (four) hours as needed. For pain 07/08/11   Kathryne Hitch, MD  PARoxetine (PAXIL) 30 MG tablet Take 30 mg by mouth every evening.  10/29/10   Historical Provider, MD  pravastatin (PRAVACHOL) 20 MG tablet Take 1 tablet (20 mg total) by mouth every evening. 06/15/11 06/14/12  Laurey Morale, MD  Probiotic Product (PROBIOTIC PO) Take 1 tablet by mouth daily with breakfast.     Historical Provider, MD  warfarin (COUMADIN) 5 MG tablet Take 2.5-5 mg by mouth every evening. Take 0.5 tablet on Mon and Fri, take 1 tablet the rest of the days.    Historical Provider, MD    Allergies:  No Known Allergies  Social History:   reports that he has never smoked. He has never used smokeless tobacco. He reports that he does not drink alcohol or use illicit drugs.  Family History: Family History  Problem Relation Age of Onset  . Stroke    . Hypertension      Physical Exam: Filed Vitals:   08/11/11 0250 08/11/11 0315 08/11/11 0345 08/11/11 0357  BP: 188/47 203/67 172/76 156/80  Pulse: 87 98 98 106  Temp:      TempSrc:      Resp: 16 16 16 20   Weight:      SpO2:       Blood pressure 156/80, pulse 106, temperature 98.2 F (36.8 C), temperature source Oral, resp. rate  20, weight 86.2 kg (190 lb 0.6 oz), SpO2 91.00%. Gen: Elderly M in HD stretcher, awakens briefly and answers 1-2 words, then dozes back off. Can't  really give history. Smells uremic. Snoring loudly but breathing overall comfortably.  HEENT: Pupils round, equal, EOMI, but can't really fully assess. MOuth does appear dry and  lips cracked Lungs: Grossly CTAB no w/c/r, anteriorly, but also limited exam. No cough, no increased WOB or  accessory muscles Heart: Regular, not tachycardic, but with crescnedo/decrescendo murmur noted through precordium with  preserved S1/2.  Abd: Soft, not rigid or peritoneal, no grimacing to palpation.  Extrem: Warm, perfusing normally, radials palpable, HD cath in LUE, BLE's without edema but with  diffuse scratches. No asterixis noted.  Neuro: Not alert but obtunded, but does awaken to vigorous voice and looks around, answers a question,  dozes off. Face symmetric, speech not fully clear. Not further assessable.    Labs & Imaging Results for orders placed during the hospital encounter of 08/10/11 (from the past 48 hour(s))  CBC     Status: Abnormal   Collection Time   08/10/11 10:24 PM      Component Value Range Comment   WBC 12.6 (*) 4.0 - 10.5 (K/uL)    RBC 3.00 (*) 4.22 - 5.81 (MIL/uL)    Hemoglobin 9.7 (*) 13.0 - 17.0 (g/dL)    HCT 56.2 (*) 13.0 - 52.0 (%)    MCV 100.3 (*) 78.0 - 100.0 (fL)    MCH 32.3  26.0 - 34.0 (pg)    MCHC 32.2  30.0 - 36.0 (g/dL)    RDW 86.5 (*) 78.4 - 15.5 (%)    Platelets 257  150 - 400 (K/uL)   DIFFERENTIAL     Status: Abnormal   Collection Time   08/10/11 10:24 PM      Component Value Range Comment   Neutrophils Relative 77  43 - 77 (%)    Neutro Abs 9.8 (*) 1.7 - 7.7 (K/uL)    Lymphocytes Relative 8 (*) 12 - 46 (%)    Lymphs Abs 1.0  0.7 - 4.0 (K/uL)    Monocytes Relative 13 (*) 3 - 12 (%)    Monocytes Absolute 1.6 (*) 0.1 - 1.0 (K/uL)    Eosinophils Relative 2  0 - 5 (%)    Eosinophils Absolute 0.2  0.0 - 0.7 (K/uL)     Basophils Relative 0  0 - 1 (%)    Basophils Absolute 0.0  0.0 - 0.1 (K/uL)   COMPREHENSIVE METABOLIC PANEL     Status: Abnormal   Collection Time   08/10/11 10:24 PM      Component Value Range Comment   Sodium 129 (*) 135 - 145 (mEq/L)    Potassium 7.0 (*) 3.5 - 5.1 (mEq/L)    Chloride 89 (*) 96 - 112 (mEq/L)    CO2 23  19 - 32 (mEq/L)    Glucose, Bld 118 (*) 70 - 99 (mg/dL)    BUN 696 (*) 6 - 23 (mg/dL)    Creatinine, Ser 2.95 (*) 0.50 - 1.35 (mg/dL)    Calcium 28.4 (*) 8.4 - 10.5 (mg/dL)    Total Protein 7.3  6.0 - 8.3 (g/dL)    Albumin 3.0 (*) 3.5 - 5.2 (g/dL)    AST 20  0 - 37 (U/L)    ALT 19  0 - 53 (U/L)    Alkaline Phosphatase 103  39 - 117 (U/L)    Total Bilirubin 0.3  0.3 - 1.2 (mg/dL)    GFR calc non Af Amer 7 (*) >90 (mL/min)    GFR calc Af Amer 8 (*) >90 (mL/min)   PROTIME-INR     Status: Abnormal   Collection Time   08/10/11 10:24 PM      Component Value Range Comment   Prothrombin Time 35.4 (*)  11.6 - 15.2 (seconds)    INR 3.47 (*) 0.00 - 1.49    POCT I-STAT 3, BLOOD GAS (G3+)     Status: Abnormal   Collection Time   08/11/11  2:23 AM      Component Value Range Comment   pH, Arterial 7.371  7.350 - 7.450     pCO2 arterial 42.3  35.0 - 45.0 (mmHg)    pO2, Arterial 62.0 (*) 80.0 - 100.0 (mmHg)    Bicarbonate 24.5 (*) 20.0 - 24.0 (mEq/L)    TCO2 26  0 - 100 (mmol/L)    O2 Saturation 91.0      Acid-base deficit 1.0  0.0 - 2.0 (mmol/L)    Patient temperature 98.6 F      Collection site RADIAL, ALLEN'S TEST ACCEPTABLE      Drawn by RT      Sample type ARTERIAL      Dg Chest 2 View  08/10/2011  *RADIOLOGY REPORT*  Clinical Data: Shortness of breath.  CHEST - 2 VIEW  Comparison: 03/04/2011  Findings: Shallow inspiration.  Borderline heart size and pulmonary vascularity, likely normal for technique.  Calcified and ectatic aorta.  Prior right paratracheal soft tissues similar to previous study, likely representing vascular shadows.  No blunting of costophrenic angles.   No pneumothorax.  No focal airspace consolidation.  Degenerative changes in the spine.  IMPRESSION: Shallow inspiration accounts for prominent heart size and pulmonary vascularity.  Calcification and ectasia of the aorta.  No change since previous study.  Original Report Authenticated By: Marlon Pel, M.D.   Ct Head Wo Contrast  08/11/2011  *RADIOLOGY REPORT*  Clinical Data: Altered mental status.  The patient is on Coumadin. Falls.  CT HEAD WITHOUT CONTRAST  Technique:  Contiguous axial images were obtained from the base of the skull through the vertex without contrast.  Comparison: 05/05/2007  Findings: Technically limited study due to motion artifact. Diffuse cerebral atrophy.  Ventricular dilatation consistent with central atrophy.  Low attenuation change in the deep white matter consistent with small vessel ischemia.  No mass effect or midline shift.  No abnormal extra-axial fluid collections.  Gray-white matter junctions are distinct.  Basal cisterns are not effaced. Vascular calcifications.  Visualized paranasal sinuses and mastoid air cells are not opacified.  No depressed skull fractures.  No significant changes since the previous study.  IMPRESSION: No acute intracranial abnormalities.  Chronic atrophy and small vessel ischemic changes.  Original Report Authenticated By: Marlon Pel, M.D.   Dg Knee Complete 4 Views Left  08/10/2011  *RADIOLOGY REPORT*  Clinical Data: Knee pain.  No known trauma.  LEFT KNEE - COMPLETE 4+ VIEW  Comparison: MRI left knee 11/24/2010  Findings: Mild diffuse bone demineralization.  Degenerative changes in the knee with tricompartmental narrowing and osteophytosis. There is a large left knee effusion.  No acute fracture or subluxation is suggested.  Old ununited ossicle over the anterior aspect of the proximal tibia.  Loose body is not excluded. Vascular calcifications.  IMPRESSION: Degenerative changes in the left knee.  Large left knee effusion. Old ununited  ossicle demonstrated anterior to the tibia.  Loose body is not excluded.  Original Report Authenticated By: Marlon Pel, M.D.     ECG: NSR 77 bpm, LAD with LAFB, LAE, normal PR, narrow QRS, no Q waves, good RWP, no frank ST  deviations, peaked T's diffusely.    Impression Present on Admission:  .ANEMIA .Synovitis of knee .Atrial fibrillation .Uremia .ESRD (end stage renal disease) .  Hyperkalemia .Leukocytosis .Encephalopathy  80yOM with h/o ESRD on HD, aortic stenosis and insufficiency, Afib, orthostatic hypoTN, dCHF, and  chronic left knee effusions who presents with persistent left knee pain leading to missed HD sessions.   1. Encephalopathy: Pt cannot stay awake for more than a few minutes and not answering appropriately. Of  note, it is 340 am and he is quite elderly, so not sure if he's just sleepy and hard to rouse after  getting 1mg  dilaudid in ED. ABG with minimal hypoxia, PaO2 corresponds to about 90% saturation. CT head  negative. DDx includes uremic encephalopathy, medication effect of dilaudid in ESRD, or just the late  hour.  - Monitor mental status for now, address underlying issues as below, keep on [place] oxygen. Cardiac enzymes  x1.   2. ESRD on HD, missed sessions, hyperK, hypoNa: Baseline Cr 4-6, now 7 but with frank uremia at 156,  and hyperK. Getting HD, follow up BMET  3. Left knee pain: Chronic issue, but not sure if this is acutely worse as he cannot give history. Will  need to follow this up. Of note, INR is high, so ? hemarthrosis? Stable issue overnight, but in the am  wuold get more history and get in touch with Dr. Magnus Ivan for advice.   4. Supratherapeutic INR: Holding coumadin and trend daily INR.   5. Leukocytosis: Seems to run a bit on the high side. No documented or reported fevers, CXR clear. Will  get UA if he makes urine, but I'm not sure he does. Monitor for now, no ABx. Could be stress reaction  to ? fall today.   6. Goals of care:  See Dr. Hadley Pen note. He has expressed disatisfaction with quality of life.  Consider palliative, stopping HD, etc.   7. Anemia: Hgb 9.7 is stable.   8. HTN, h/o dCHF, aortic disease: Hemodynamics look stable. continue ACEi, ASA 81, statin, toprol  Telemetry, MC team 4 Full code for now, presumed. Follow up  Other plans as per orders.  Jocelyne Reinertsen 08/11/2011, 4:08 AM

## 2011-08-11 NOTE — ED Notes (Signed)
Calcium ordered from pharmacy 

## 2011-08-11 NOTE — Progress Notes (Signed)
Chaplain witnessed family members in the hallway crying. Chaplain stopped by the patient's room to offer support. Patient and his wife stated that they had just made the decision to stop dialysis treatments on the patient. Both said that they had clerical support and were coping okay. Chaplain let them know of the 24/7 availability of chaplains. Family will contact if needed.

## 2011-08-11 NOTE — Progress Notes (Signed)
ANTICOAGULATION CONSULT NOTE - Initial Consult  Pharmacy Consult for Coumadin Indication: atrial fibrillation  No Known Allergies  Patient Measurements: Height: 5\' 7"  (170.2 cm) Weight: 190 lb 4.1 oz (86.3 kg) IBW/kg (Calculated) : 66.1   Vital Signs: Temp: 98.5 F (36.9 C) (05/30 0652) Temp src: Oral (05/30 0652) BP: 145/75 mmHg (05/30 0700) Pulse Rate: 88  (05/30 0700)  Labs:  Basename 08/10/11 2224  HGB 9.7*  HCT 30.1*  PLT 257  APTT --  LABPROT 35.4*  INR 3.47*  HEPARINUNFRC --  CREATININE 7.05*  CKTOTAL --  CKMB --  TROPONINI --    Estimated Creatinine Clearance: 9.1 ml/min (by C-G formula based on Cr of 7.05).   Medical History: Past Medical History  Diagnosis Date  . Atrial fibrillation   . HTN (hypertension)   . GERD (gastroesophageal reflux disease)   . Depression   . Microcytic anemia   . Leukocytosis   . Hyponatremia   . Complication of anesthesia     hypotension after colonoscopy   . Shortness of breath     when walking   . Sleep apnea     new diagnosis CPAP since 1/13 settings 10   . Arthritis   . CHF (congestive heart failure)     diastolic heart failure   . Carotid artery stenosis     RICA 40-59% per OV note of Dr Shirlee Latch 2/13   . Pneumonia     adm 10/12 wtih pneumonia   . Aortic stenosis     mild  . Aortic insufficiency     mild to moderate per note 2/13 of cardiologist   . Peritonitis     hx of with peritoneal dialysis 2009  . Hyperparathyroidism, secondary   . ESRD (end stage renal disease)     dialysis x 8 yrs   . Nephrolithiasis     hx of per office note of 2/13   . Esophageal dysmotility   . Dialysis patient     Left upper arm fistula, goes to [facility] in [place]     Medications:  Prescriptions prior to admission  Medication Sig Dispense Refill  . aspirin EC 81 MG tablet Take 81 mg by mouth every evening.      Marland Kitchen b complex-vitamin c-folic acid (NEPHRO-VITE) 0.8 MG TABS Take 0.8 mg by mouth at bedtime.       .  calcium citrate-vitamin D (CITRACAL+D) 315-200 MG-UNIT per tablet Take 1 tablet by mouth 2 (two) times daily. Takes one with each meal      . diphenhydrAMINE (BENADRYL) 25 MG tablet Take 25 mg by mouth daily as needed. For allergies      . fosinopril (MONOPRIL) 20 MG tablet Take 10 mg by mouth every evening.       . megestrol (MEGACE) 40 MG tablet Take 40 mg by mouth daily with breakfast.       . metoprolol succinate (TOPROL-XL) 25 MG 24 hr tablet Take 25 mg by mouth daily. Takes in evening      . omeprazole (PRILOSEC) 20 MG capsule Take 40 mg by mouth daily with breakfast.       . oxycodone (OXY-IR) 5 MG capsule Take 5 mg by mouth every 6 (six) hours as needed.      Marland Kitchen oxyCODONE-acetaminophen (PERCOCET) 5-325 MG per tablet Take 1-2 tablets by mouth every 4 (four) hours as needed. For pain  60 tablet  0  . PARoxetine (PAXIL) 30 MG tablet Take 30 mg by mouth every evening.       Marland Kitchen  pravastatin (PRAVACHOL) 20 MG tablet Take 1 tablet (20 mg total) by mouth every evening.  90 tablet  4  . Probiotic Product (PROBIOTIC PO) Take 1 tablet by mouth daily with breakfast.       . warfarin (COUMADIN) 5 MG tablet Take 2.5-5 mg by mouth every evening. Take 0.5 tablet on Mon and Fri, take 1 tablet the rest of the days.       Scheduled:    . aspirin EC  81 mg Oral QPM  . calcium gluconate  1 g Intravenous Once  . darbepoetin (ARANESP) injection - DIALYSIS  100 mcg Intravenous Q Thu-HD  . dextrose  50 mL Intravenous Once  . docusate sodium  100 mg Oral BID  .  HYDROmorphone (DILAUDID) injection  1 mg Intravenous Once  . insulin aspart  8 Units Intravenous Once  . lisinopril  20 mg Oral Daily  . metoprolol      . multivitamin  1 tablet Oral QHS  . ondansetron  4 mg Intravenous Once  . PARoxetine  30 mg Oral QPM  . senna  1 tablet Oral BID  . simvastatin  10 mg Oral q1800  . sodium bicarbonate  25 mEq Intravenous Once  . sodium chloride  3 mL Intravenous Q12H  . sodium chloride  3 mL Intravenous Q12H  .  sodium polystyrene  30 g Oral Once  . Warfarin - Pharmacist Dosing Inpatient   Does not apply q1800    Assessment: 78yo male admitted for Afib w/ RVR, on Coumadin PTA for same, to continue during admission; admitted with supratherapeutic INR, had been at upper end of goal at outpt anticoag visit on 5/16.  Goal of Therapy:  INR 2-3   Plan:  Will hold Coumadin dose today and adjust dosing per daily INR.  Colleen Can PharmD BCPS 08/11/2011,7:13 AM

## 2011-08-11 NOTE — Progress Notes (Signed)
Patient admitted early this AM by Dr. Kaylyn Layer.  Placed in step-down due to rapid afib.  Placed on cardiazem gtt.  Was maxed out and still not having HR control- converted on own to NSR.  Will consult patient's cardiology.  Patient was brought in when he did not get dialysis since Saturday.   C/o knee pain Still altered.  Nephrology following BMP this PM  Marlin Canary, D.O.

## 2011-08-11 NOTE — Progress Notes (Signed)
PT Cancellation Note  Treatment cancelled today due to medical issues with patient which prohibited therapy. Patient resting in bed and HR ranging from 120's to 170's. Evaluation was deferred today and we will follow up at later date.   Edwyna Perfect, PT  Pager [phone]  08/11/2011, 8:01 AM

## 2011-08-11 NOTE — Progress Notes (Signed)
Palliative Medicine Team consult requested by Dr Briant Cedar for EOL issues, symptom mangaement solidify goals as patient wanting to stop HD- spoke with patient's wife and daughters at bedside meeting scheduled for today Thursday 5/30 @ 5:30-6 pm,   Valente David, RN 08/11/2011, 4:36 PM Palliative Medicine Team RN Liaison [phone]

## 2011-08-11 NOTE — ED Notes (Signed)
Unable to obtain IV access.  MD aware.  MD to place EJ after kidney MD finishes assessment.

## 2011-08-11 NOTE — Progress Notes (Signed)
Called by HD nurse due to change in patient's HR, NP notified as well. Upon arrival EKG being obtained, HD running, SBP 170s, HR 180s, patient lethargic and responds at times. EKG shows afib rvr, NP at bedside. Lopressor 5 mg IV given, Cardizem 5 mg bolus given and drip started at 5 mg then titrated to 10 mg, SBP stable. HD nurse called report to 2900. Helped transfer, will continue to monitor.

## 2011-08-11 NOTE — Consult Note (Signed)
[facility]  HISTORY AND PHYSICAL  Casey [name] is an 78 y.o. male.    Chief Complaint: SOB, knee pain, weakness, falls HPI: Pt is a 78yo WM with multiple medical problems sig for A fib, HTN, ESRD, OSA, ASCVD, CHF, and orthostatic hypotension who has had multiple falls over the last week due to weakness.  He has also had increasing left knee pain and was unable to ambulate or go to HD since Saturday 08/06/11.  He was brought to [facility] via EMS after another fall today and unable to get up.  We were asked to see the patient due to hyperkalemia and need for dialysis.  PMH: Past Medical History  Diagnosis Date  . Atrial fibrillation   . HTN (hypertension)   . GERD (gastroesophageal reflux disease)   . Depression   . Microcytic anemia   . Leukocytosis   . Hyponatremia   . Complication of anesthesia     hypotension after colonoscopy   . Shortness of breath     when walking   . Sleep apnea     new diagnosis CPAP since 1/13 settings 10   . Arthritis   . CHF (congestive heart failure)     diastolic heart failure   . Carotid artery stenosis     RICA 40-59% per OV note of Dr Shirlee Latch 2/13   . Pneumonia     adm 10/12 wtih pneumonia   . Aortic stenosis     mild  . Aortic insufficiency     mild to moderate per note 2/13 of cardiologist   . Peritonitis     hx of with peritoneal dialysis 2009  . Hyperparathyroidism, secondary   . ESRD (end stage renal disease)     dialysis x 8 yrs   . Nephrolithiasis     hx of per office note of 2/13   . Esophageal dysmotility   . Dialysis patient     Left upper arm fistula, goes to [facility] in [place]    PSH: Past Surgical History  Procedure Date  . Other surgical history     left upper arm fistula  on peritoneal dialysis for first four years   . Other surgical history     surgery for peritoneal dialysis   . Knee arthroscopy 07/08/2011    Procedure: ARTHROSCOPY KNEE;  Surgeon: Kathryne Hitch, MD;  Location: [facility]  ORS;  Service: Orthopedics;  Laterality: Left;  Left Knee Arthroscopy, Debridement and Synovectomy  . Synovectomy 07/08/2011    Procedure: SYNOVECTOMY;  Surgeon: Kathryne Hitch, MD;  Location: [facility] ORS;  Service: Orthopedics;  Laterality: Left;    DIALYSIS: Dialyzes at [facility] on [facility]. Primary Nephrologist Jonathandavid Marlett. HD Bath 2K/2.25Ca, Dialyzer optifulx 180. Access LUE AVF.  Past Medical History  Diagnosis Date  . Atrial fibrillation   . HTN (hypertension)   . GERD (gastroesophageal reflux disease)   . Depression   . Microcytic anemia   . Leukocytosis   . Hyponatremia   . Complication of anesthesia     hypotension after colonoscopy   . Shortness of breath     when walking   . Sleep apnea     new diagnosis CPAP since 1/13 settings 10   . Arthritis   . CHF (congestive heart failure)     diastolic heart failure   . Carotid artery stenosis     RICA 40-59% per OV note of Dr Shirlee Latch 2/13   . Pneumonia     adm 10/12 wtih pneumonia   .  Aortic stenosis     mild  . Aortic insufficiency     mild to moderate per note 2/13 of cardiologist   . Peritonitis     hx of with peritoneal dialysis 2009  . Hyperparathyroidism, secondary   . ESRD (end stage renal disease)     dialysis x 8 yrs   . Nephrolithiasis     hx of per office note of 2/13   . Esophageal dysmotility   . Dialysis patient     Left upper arm fistula, goes to [facility] in [place]     Medications:  I have reviewed the patient's current medications.   (Not in a hospital admission)  ALLERGIES:  No Known Allergies  FAM HX: Family History  Problem Relation Age of Onset  . Stroke    . Hypertension      Social History:   reports that he has never smoked. He has never used smokeless tobacco. He reports that he does not drink alcohol or use illicit drugs.  ROS: A comprehensive review of systems was negative except for: Constitutional: positive for anorexia, fatigue and malaise Respiratory: positive for  cough and dyspnea on exertion Gastrointestinal: positive for abdominal pain and nausea Musculoskeletal: positive for arthralgias, muscle weakness and left knee pain Neurological: positive for coordination problems, weakness and falls Behavioral/Psych: positive for anorexia, depression and memory deficits, altered sense of day and night  PE: General appearance: delirious and mild distress Head: Normocephalic, without obvious abnormality, atraumatic Eyes: negative findings: lids and lashes normal, conjunctivae and sclerae normal, corneas clear and pupils equal, round, reactive to light and accomodation Neck: no adenopathy, no JVD, supple, symmetrical, trachea midline, thyroid not enlarged, symmetric, no tenderness/mass/nodules and right carotid bruit present Resp: clear to auscultation bilaterally Cardio: irregularly irregular rhythm GI: normal findings: bowel sounds normal and abnormal findings:  LLQ tenderness to deep palpation, no guarding or rebound Extremities: no ulcers, gangrene or trophic changes and left knee swollen, tender, and warm to touch, no erythema Neurologic: Mental status: orientation: person, place, president, did not know month, year, or name of hospital but knew he was in an hospital Motor: grossly weak, symmetrical Coordination: unable to follow commands appropriately, +asterixis and myoclonic jerking Gait: unable to ambulate due to weakness and left leg pain BMP:  Lab 08/10/11 2224  NA 129*  K 7.0*  CL 89*  CO2 23  GLUCOSE 118*  BUN 156*  CREATININE 7.05*  ALB --  CALCIUM 10.6*  PHOS --   Liver Function Tests:  Lab 08/10/11 2224  AST 20  ALT 19  ALKPHOS 103  BILITOT 0.3  PROT 7.3  ALBUMIN 3.0*   No results found for this basename: LIPASE:3,AMYLASE:3 in the last 168 hours No results found for this basename: AMMONIA:3 in the last 168 hours CBC:  Lab 08/10/11 2224  WBC 12.6*  NEUTROABS 9.8*  HGB 9.7*  HCT 30.1*  MCV 100.3*  PLT 257    PT/INR: @labrcntip (inr:5) Cardiac Enzymes: No results found for this basename: CKTOTAL:5,CKMB:5,CKMBINDEX:5,TROPONINI:5 in the last 168 hours CBG: No results found for this basename: GLUCAP:5 in the last 168 hours  Iron Studies: No results found for this basename: IRON:30,TIBC:30,TRANSFERRIN:30,FERRITIN:30 in the last 168 hours  Results for orders placed during the hospital encounter of 08/10/11 (from the past 48 hour(s))  CBC     Status: Abnormal   Collection Time   08/10/11 10:24 PM      Component Value Range Comment   WBC 12.6 (*) 4.0 - 10.5 (K/uL)  RBC 3.00 (*) 4.22 - 5.81 (MIL/uL)    Hemoglobin 9.7 (*) 13.0 - 17.0 (g/dL)    HCT 29.5 (*) 62.1 - 52.0 (%)    MCV 100.3 (*) 78.0 - 100.0 (fL)    MCH 32.3  26.0 - 34.0 (pg)    MCHC 32.2  30.0 - 36.0 (g/dL)    RDW 30.8 (*) 65.7 - 15.5 (%)    Platelets 257  150 - 400 (K/uL)   DIFFERENTIAL     Status: Abnormal   Collection Time   08/10/11 10:24 PM      Component Value Range Comment   Neutrophils Relative 77  43 - 77 (%)    Neutro Abs 9.8 (*) 1.7 - 7.7 (K/uL)    Lymphocytes Relative 8 (*) 12 - 46 (%)    Lymphs Abs 1.0  0.7 - 4.0 (K/uL)    Monocytes Relative 13 (*) 3 - 12 (%)    Monocytes Absolute 1.6 (*) 0.1 - 1.0 (K/uL)    Eosinophils Relative 2  0 - 5 (%)    Eosinophils Absolute 0.2  0.0 - 0.7 (K/uL)    Basophils Relative 0  0 - 1 (%)    Basophils Absolute 0.0  0.0 - 0.1 (K/uL)   COMPREHENSIVE METABOLIC PANEL     Status: Abnormal   Collection Time   08/10/11 10:24 PM      Component Value Range Comment   Sodium 129 (*) 135 - 145 (mEq/L)    Potassium 7.0 (*) 3.5 - 5.1 (mEq/L)    Chloride 89 (*) 96 - 112 (mEq/L)    CO2 23  19 - 32 (mEq/L)    Glucose, Bld 118 (*) 70 - 99 (mg/dL)    BUN 846 (*) 6 - 23 (mg/dL)    Creatinine, Ser 9.62 (*) 0.50 - 1.35 (mg/dL)    Calcium 95.2 (*) 8.4 - 10.5 (mg/dL)    Total Protein 7.3  6.0 - 8.3 (g/dL)    Albumin 3.0 (*) 3.5 - 5.2 (g/dL)    AST 20  0 - 37 (U/L)    ALT 19  0 - 53 (U/L)     Alkaline Phosphatase 103  39 - 117 (U/L)    Total Bilirubin 0.3  0.3 - 1.2 (mg/dL)    GFR calc non Af Amer 7 (*) >90 (mL/min)    GFR calc Af Amer 8 (*) >90 (mL/min)   PROTIME-INR     Status: Abnormal   Collection Time   08/10/11 10:24 PM      Component Value Range Comment   Prothrombin Time 35.4 (*) 11.6 - 15.2 (seconds)    INR 3.47 (*) 0.00 - 1.49      Dg Chest 2 View  08/10/2011  *RADIOLOGY REPORT*  Clinical Data: Shortness of breath.  CHEST - 2 VIEW  Comparison: 03/04/2011  Findings: Shallow inspiration.  Borderline heart size and pulmonary vascularity, likely normal for technique.  Calcified and ectatic aorta.  Prior right paratracheal soft tissues similar to previous study, likely representing vascular shadows.  No blunting of costophrenic angles.  No pneumothorax.  No focal airspace consolidation.  Degenerative changes in the spine.  IMPRESSION: Shallow inspiration accounts for prominent heart size and pulmonary vascularity.  Calcification and ectasia of the aorta.  No change since previous study.  Original Report Authenticated By: Marlon Pel, M.D.   Dg Knee Complete 4 Views Left  08/10/2011  *RADIOLOGY REPORT*  Clinical Data: Knee pain.  No known trauma.  LEFT KNEE - COMPLETE 4+  VIEW  Comparison: MRI left knee 11/24/2010  Findings: Mild diffuse bone demineralization.  Degenerative changes in the knee with tricompartmental narrowing and osteophytosis. There is a large left knee effusion.  No acute fracture or subluxation is suggested.  Old ununited ossicle over the anterior aspect of the proximal tibia.  Loose body is not excluded. Vascular calcifications.  IMPRESSION: Degenerative changes in the left knee.  Large left knee effusion. Old ununited ossicle demonstrated anterior to the tibia.  Loose body is not excluded.  Original Report Authenticated By: Marlon Pel, M.D.    Assessment/Plan 1. Hyperkalemia- due to missed HD treatments.  Treated with calcium, insulin/D50.  Plan  for urgent HD tonight 1. AMS/falls- pt with memory deficits that are new. ?acute illness, CVA, metabolic encephelopathy, hypercapnea, polypharmacy in elderly, dementia, or depression 1. Check ABG 2. Plan for HD  3. CT scan of head given falls and coumadin as well as carotid disease.  2. ESRD- plan for HD now 3. A fib- rate controlled 4. anticoag- supratherapeutic. Hold coumadin 5. FTT and anorexia- recommend palliative care/hospice consult as pt's overall condition and QOL have significantly declined over the last year (more rapidly over the last several months) and has voiced displeasure with his QOL and ongoing HD.  May be time for withdrawal from HD and hospice if he is not improving or irreversible process 6. Left knee effusion/pain- h/o hemarthrosis and DJD.  Agree with Ortho eval by Dr. Magnus Ivan in am for arthrocentesis 7. OSA- CPAP per primary svc 8. Disposition- as above.  I had a lengthy discussion with wife and daughter.  They are aware of his displeasure with his QOL and he has stated in the past that he was ready to stop HD.  If he does not improve, hospice consult with family meeting would be very appropriate 9. Cough- no evidence of PNA- may be related to ace inhibitor.  Would hold fosinopril and follow 10. ACDz- resume outpt epo dose   Daquane Aguilar A 08/11/2011, 12:29 AM

## 2011-08-11 NOTE — Progress Notes (Signed)
Event: Called by MD Bonno to see pt in HD 2/2 HR in 170s. NP to bedside. Denyse Amass RN in HD says he was performing HD when the pt became tachycardic in the 140s. Denyse Amass stopped HD at that time. Then pt became more tachy in the 170s and rapid response was called. NP to bedside. S: Pt can not participate in extensive ROS 2/2 mental status. I reviewed Dr. Dionne Ano note from ED and pt appears to be in same mental state as he was in ED before HD. Pt does respond "no" when he is asked about chest pain or SOB.  RN says he hasn't c/o anything since being in HD, even when tachycardia occurred. O: VS reviewed. Upon arrival, pt lying on HD stretcher with eyes closed. He is frail, elderly and in NAD. He is arousable but immediately goes back to sleep. Card: irreg irreg rhythm, tachy. Resp effort is normal, and lungs CTA with what I can hear, but he doesn't participate in exam. MOE x 4. When he does respond his speech is clear and I don't see any focal neuro deficits.  A/P:  1. Afib with RVR-he has a hx of this and is on Coumadin. Metoprolol 5mg  IV given and HR down to 130s. Cardizem gtt ordered and RNs are working on getting that started. Pt will go to SD status at unit 2900. I tried to reach his wife at both numbers on his chart, but no answer.  2. ESRD-he did finish HD, only 4cc remaining.  Dr. Kaylyn Layer aware of event, plan.  Maren Reamer, NP [facility]

## 2011-08-11 NOTE — Consult Note (Signed)
CARDIOLOGY CONSULT NOTE  Patient ID: Casey [name], MRN: [id number], DOB/AGE: [id number] y.o. Admit date: 08/10/2011   Date of Consult: 08/11/2011 Primary Physician: [name], MD, MD Primary Cardiologist: Dr. Shirlee Latch  Chief Complaint: knee pain, missed dialysis, weakness, falls Reason for Consult: atrial fib RVR  HPI: 78 y/o M with hx of PAF on Coumadin, ESRD, sleep apnea, diastolic CHF, mild-mod AS/AI, chronic knee effusions presented to [facility] with complaints of worsening knee pain. He underwent L knee arthroscopy end of April but presented with increased weakness and a fall. He also had missed HD for the past 2 sessions. The patient is a poor historian and cannot reliably provide information so most of history is obtained from family. They relays a recent decline in his mental status over the last 2 weeks, stating sometimes he forgets that he already was given his medicine and at times does not know if it's morning or night. They are also concerned because he has been falling lately and does not seem strong enough to get himself up.  CT head was done for AMS/obtundation which was negative for acute disease. His encephalopathy is of unclear etiology thus far, at this time felt possibly related to uremia, medication (narcotic) use, or dementia. The patient is able to report great increase in L knee pain for 3 days. Left knee plain film showed large left knee effusion, cannot rule out loose body. Orthopedics has also been consulted per primary team for evaluation.   In the ER, vitals stable except HTN up to 203/67. Labs with Na 129, hyperK 7.0, HCO3 23, BUN/Cr 156/7.05. WBC 12.6, Hgb 9.7. INR was high at 3.47.  Pt was given Ca gluconate, 50 cc dextrose, 8u novolog, 1mg  dilaudid, 4mg  zofran, Na bicarb, kayexalate and taken to dialysis overnight/very early this morning. Per nursing staff there may be plans to take him back later today based on afternoon labs. While in dialysis  earlier, he went into atrial fib with RVR. SBP was 170s, HR in the 180's. He was given 5mg  IV Lopressor, 5mg  cardizem bolus and gtt later titrated up to 20mg /hr. He was still going fast this morning and had another dose of IV metoprolol ordered, but before the nurse could give it, he spontaneously converted to NSR at 9:37am. He remains on a cardizem gtt at 15mg /hr. He denies any CP, SOB or palpitations.   Past Medical History  Diagnosis Date  . Paroxysmal atrial fibrillation     Initially diagnosed 08/2009  . HTN (hypertension)   . GERD (gastroesophageal reflux disease)   . Depression   . Microcytic anemia   . Leukocytosis   . Hyponatremia   . Complication of anesthesia     hypotension after colonoscopy   . Sleep apnea     new diagnosis CPAP since 1/13 settings 10   . Arthritis   . CHF (congestive heart failure)     diastolic heart failure   . Carotid artery stenosis     RICA 40-59% per OV note of Dr Shirlee Latch 2/13   . Pneumonia     adm 10/12 wtih pneumonia   . Aortic stenosis     mild to moderate per 2/13 echo  . Aortic insufficiency     mild to moderate per note 2/13 of cardiologist   . Peritonitis     Had MRSE peritonitis with PD 2009, necessitating change to HD  . Hyperparathyroidism, secondary   . ESRD (end stage renal disease)  dialysis x 8 yrs. Has secondary hyperparathyroidism.  . Nephrolithiasis     hx of per office note of 2/13   . Esophageal dysmotility   . Dialysis patient     Left upper arm fistula, goes to [facility] in [place]   . Renal vascular disease     Occluded renal arteries bilaterally on cath 3/10.    . Carotid artery disease     40-59% RICA/0-39% LICA with ectatic, tortuous, redundant carotid arteries and subclavian arteries with at least mild plaque bilaterally (rec CTA but does not appear to have been done)  . CAD (coronary artery disease)     Nonobstructive 2010      Most Recent Cardiac Studies: 2D Echo 04/2011 Study Conclusions - Left  ventricle: The cavity size was normal. Wall thickness was increased in a pattern of mild LVH. Systolic function was normal. The estimated ejection fraction was in the range of 55% to 60%. Wall motion was normal; there were no regional wall motion abnormalities. - Aortic valve: There was mild to moderate stenosis. Mild to moderate regurgitation. Mean gradient: 23mm Hg (S). Peak gradient: 44mm Hg (S). AVA 1.43 cm^2. - Aorta: 4.1 cm aortic root, mildly dilated. - Mitral valve: No significant regurgitation. - Left atrium: The atrium was mildly dilated. - Right ventricle: The cavity size was normal. Systolic function was normal. Impressions: - Limited echo to reassess AS. Normal LV size and systolic function, EF 55-60%. Mild LV hypertrophy. Trileaflet aortic valve with mild to moderate AS and mild to moderate AI. Mildly dilated aortic root  Lexiscan Myoview 01/2011 QGS cine images: NL LV Function; NL Wall Motion  QGS EF: 50%  Impression  Exercise Capacity: Lexiscan with no exercise.  BP Response: Normal blood pressure response.  Clinical Symptoms: No chest pain.  ECG Impression: No significant ST segment change suggestive of ischemia.  Comparison with Prior Nuclear Study: No images to compare  Overall Impression: Normal stress nuclear study.   Surgical History:  Past Surgical History  Procedure Date  . Other surgical history     left upper arm fistula  on peritoneal dialysis for first four years   . Other surgical history     surgery for peritoneal dialysis   . Knee arthroscopy 07/08/2011    Procedure: ARTHROSCOPY KNEE;  Surgeon: Kathryne Hitch, MD;  Location: [facility] ORS;  Service: Orthopedics;  Laterality: Left;  Left Knee Arthroscopy, Debridement and Synovectomy  . Synovectomy 07/08/2011    Procedure: SYNOVECTOMY;  Surgeon: Kathryne Hitch, MD;  Location: [facility] ORS;  Service: Orthopedics;  Laterality: Left;     Home Meds: Prior to Admission medications   Medication Sig Start  Date End Date Taking? Authorizing Provider  aspirin EC 81 MG tablet Take 81 mg by mouth every evening.    Historical Provider, MD  b complex-vitamin c-folic acid (NEPHRO-VITE) 0.8 MG TABS Take 0.8 mg by mouth at bedtime.     Historical Provider, MD  calcium citrate-vitamin D (CITRACAL+D) 315-200 MG-UNIT per tablet Take 1 tablet by mouth 2 (two) times daily. Takes one with each meal    Historical Provider, MD  diphenhydrAMINE (BENADRYL) 25 MG tablet Take 25 mg by mouth daily as needed. For allergies    Historical Provider, MD  fosinopril (MONOPRIL) 20 MG tablet Take 10 mg by mouth every evening.     Historical Provider, MD  megestrol (MEGACE) 40 MG tablet Take 40 mg by mouth daily with breakfast.     Historical Provider, MD  metoprolol succinate (  TOPROL-XL) 25 MG 24 hr tablet Take 25 mg by mouth daily. Takes in evening 04/15/11 04/14/12  Laurey Morale, MD  omeprazole (PRILOSEC) 20 MG capsule Take 40 mg by mouth daily with breakfast.     Historical Provider, MD  oxycodone (OXY-IR) 5 MG capsule Take 5 mg by mouth every 6 (six) hours as needed.    Historical Provider, MD  oxyCODONE-acetaminophen (PERCOCET) 5-325 MG per tablet Take 1-2 tablets by mouth every 4 (four) hours as needed. For pain 07/08/11   Kathryne Hitch, MD  PARoxetine (PAXIL) 30 MG tablet Take 30 mg by mouth every evening.  10/29/10   Historical Provider, MD  pravastatin (PRAVACHOL) 20 MG tablet Take 1 tablet (20 mg total) by mouth every evening. 06/15/11 06/14/12  Laurey Morale, MD  Probiotic Product (PROBIOTIC PO) Take 1 tablet by mouth daily with breakfast.     Historical Provider, MD  warfarin (COUMADIN) 5 MG tablet Take 2.5-5 mg by mouth every evening. Take 0.5 tablet on Mon and Fri, take 1 tablet the rest of the days.    Historical Provider, MD    Inpatient Medications:     . aspirin EC  81 mg Oral QPM  . calcium gluconate  1 g Intravenous Once  . darbepoetin (ARANESP) injection - DIALYSIS  100 mcg Intravenous Q Thu-HD  .  dextrose  50 mL Intravenous Once  . docusate sodium  100 mg Oral BID  .  HYDROmorphone (DILAUDID) injection  1 mg Intravenous Once  . insulin aspart  8 Units Intravenous Once  . lidocaine  1 patch Transdermal Q24H  . lisinopril  10 mg Oral QHS  . metoprolol      . metoprolol      . metoprolol  5 mg Intravenous Q6H  . multivitamin  1 tablet Oral QHS  . ondansetron  4 mg Intravenous Once  . PARoxetine  30 mg Oral QPM  . senna  1 tablet Oral BID  . simvastatin  10 mg Oral q1800  . sodium bicarbonate  25 mEq Intravenous Once  . sodium chloride  3 mL Intravenous Q12H  . sodium chloride  3 mL Intravenous Q12H  . sodium polystyrene  30 g Oral Once  . Warfarin - Pharmacist Dosing Inpatient   Does not apply q1800  . DISCONTD: lisinopril  20 mg Oral Daily  . DISCONTD: multivitamin  1 tablet Oral Daily    Allergies: No Known Allergies  History   Social History  . Marital Status: Married    Spouse Name: N/A    Number of Children: N/A  . Years of Education: N/A   Occupational History  . retired    Social History Main Topics  . Smoking status: Never Smoker   . Smokeless tobacco: Never Used  . Alcohol Use: No  . Drug Use: No  . Sexually Active: Not on file   Other Topics Concern  . Not on file   Social History Narrative  . No narrative on file     Family History  Problem Relation Age of Onset  . Stroke    . Hypertension       Review of Systems:  General: negative for chills, fever, night sweats Cardiovascular: negative for chest pain, edema, orthopnea, palpitations, paroxysmal nocturnal dyspnea, shortness of breath or dyspnea on exertion Dermatological: negative for rash Respiratory: negative for cough or wheezing Urologic: negative for hematuria Abdominal: negative for nausea, vomiting, diarrhea, bright red blood per rectum, melena, or hematemesis MSK: Knee pain  Neurologic: negative for visual changes, syncope, or dizziness but ++weakness/falls lately All other  systems reviewed and are otherwise negative except as noted above.  Labs:  Basename 08/11/11 0700  CKTOTAL 42  CKMB 2.7  TROPONINI <0.30   Lab Results  Component Value Date   WBC 12.6* 08/10/2011   HGB 9.7* 08/10/2011   HCT 30.1* 08/10/2011   MCV 100.3* 08/10/2011   PLT 257 08/10/2011     Lab 08/10/11 2224  NA 129*  K 7.0*  CL 89*  CO2 23  BUN 156*  CREATININE 7.05*  CALCIUM 10.6*  PROT 7.3  BILITOT 0.3  ALKPHOS 103  ALT 19  AST 20  GLUCOSE 118*   Lab Results  Component Value Date   CHOL 82 06/20/2011   HDL 27.90* 06/20/2011   LDLCALC 41 06/20/2011   TRIG 66.0 06/20/2011    Radiology/Studies:  1. Chest 2 View 08/10/2011  *RADIOLOGY REPORT*  Clinical Data: Shortness of breath.  CHEST - 2 VIEW  Comparison: 03/04/2011  Findings: Shallow inspiration.  Borderline heart size and pulmonary vascularity, likely normal for technique.  Calcified and ectatic aorta.  Prior right paratracheal soft tissues similar to previous study, likely representing vascular shadows.  No blunting of costophrenic angles.  No pneumothorax.  No focal airspace consolidation.  Degenerative changes in the spine.  IMPRESSION: Shallow inspiration accounts for prominent heart size and pulmonary vascularity.  Calcification and ectasia of the aorta.  No change since previous study.  Original Report Authenticated By: Marlon Pel, M.D.   2. Ct Head Wo Contrast 08/11/2011  *RADIOLOGY REPORT*  Clinical Data: Altered mental status.  The patient is on Coumadin. Falls.  CT HEAD WITHOUT CONTRAST  Technique:  Contiguous axial images were obtained from the base of the skull through the vertex without contrast.  Comparison: 05/05/2007  Findings: Technically limited study due to motion artifact. Diffuse cerebral atrophy.  Ventricular dilatation consistent with central atrophy.  Low attenuation change in the deep white matter consistent with small vessel ischemia.  No mass effect or midline shift.  No abnormal extra-axial fluid  collections.  Gray-white matter junctions are distinct.  Basal cisterns are not effaced. Vascular calcifications.  Visualized paranasal sinuses and mastoid air cells are not opacified.  No depressed skull fractures.  No significant changes since the previous study.  IMPRESSION: No acute intracranial abnormalities.  Chronic atrophy and small vessel ischemic changes.  Original Report Authenticated By: Marlon Pel, M.D.   3.  Knee Complete 4 Views Left5/29/2013  *RADIOLOGY REPORT*  Clinical Data: Knee pain.  No known trauma.  LEFT KNEE - COMPLETE 4+ VIEW  Comparison: MRI left knee 11/24/2010  Findings: Mild diffuse bone demineralization.  Degenerative changes in the knee with tricompartmental narrowing and osteophytosis. There is a large left knee effusion.  No acute fracture or subluxation is suggested.  Old ununited ossicle over the anterior aspect of the proximal tibia.  Loose body is not excluded. Vascular calcifications.  IMPRESSION: Degenerative changes in the left knee.  Large left knee effusion. Old ununited ossicle demonstrated anterior to the tibia.  Loose body is not excluded.  Original Report Authenticated By: Marlon Pel, M.D.    EKG:  1. 5/29 22:26pm - NSR peaked T's V2-V3, TWI avL 2. 5/30 6:09am - Atrial fib RVR 178bpm - slight ST depression noted in I, II, V3-V6, TWI avL  Physical Exam: Blood pressure 151/42, pulse 25, temperature 98.5 F (36.9 C), temperature source Oral, resp. rate 13, height 5\' 7"  (1.702 m),  weight 190 lb 4.1 oz (86.3 kg), SpO2 97.00%. General: Well developed, well nourished elderly WM in no acute distress. Head: Normocephalic, atraumatic, sclera non-icteric, no xanthomas, nares are without discharge.  Neck: R carotid bruits. JVD not elevated. Lungs: Clear bilaterally to auscultation without wheezes, rales, or rhonchi. Breathing is unlabored. Heart: RRR with S1 S2, somewhat distant heart sounds. No murmurs, rubs, or gallops appreciated. Abdomen: Soft,  non-tender, non-distended with normoactive bowel sounds. No hepatomegaly. No rebound/guarding. No obvious abdominal masses. Msk:  Strength and tone appear normal for age. Extremities: No clubbing or cyanosis. No edema distally but L knee effusion is present and is very tender.  Distal pedal pulses are 2+ and equal bilaterally. Neuro: Alert and oriented to self, but not to date. Knows he's in the hospital but not the name. When I try to get him to tell me his family's names, he appears to be joking that he has "no idea who they are" but even his family cannot get him to tell us. Moves all extremities spontaneously but only gingerly moves L leg. Psych:  Responds to limited questions appropriately with a flat affect, does better with yes/no questions. When asked why he's here, he says "I wish I knew."   Assessment and Plan:   1. Confusion/altered mental status/weakness/falls of unclear etiology 2. ESRD on HD with missed dialysis recently 3. Recurrence of atrial fib with RVR, currently in NSR 4. Left knee pain/effusion 5. Chronic anemia  His change in mental status is currently of unclear etiology, question contribution of uremia vs meds vs underlying dementia. This becomes problematic in terms of his falls, as he is at risk for bleeding with Coumadin. We would recommend to hold Coumadin for now given unclear trajectory of his weakness. For his atrial fib, we will titrate diltiazem gtt off. We will initiate amiodarone  - LFTs already drawn at baseline, will obtain TSH for baseline. Will change Lopressor to 25mg  po q6hr. Watch BP with med changes.  Signed, Ronie Spies PA-C 08/11/2011, 11:42 AM  Patient seen with PA, agree with the above note.   1. Atrial fibrillation: Would like to keep him out of atrial fibrillation.  Plan to start amiodarone 400 mg bid today and titrate off diltiazem gtt.  Can make metoprolol po.  I do not think that he is going to be a good long-term coumadin candidate with his  falls.  2. Confusion/falls/uremia/hyperkalemia: Nephrology is following.   Marca Ancona 08/11/2011 11:53 AM

## 2011-08-11 NOTE — Care Management Note (Addendum)
    Page 1 of 1   08/16/2011     11:48:12 AM   CARE MANAGEMENT NOTE 08/16/2011  Patient:  Casey [name], Casey [name]   Account Number:  [id number]  Date Initiated:  08/11/2011  Documentation initiated by:  Junius Creamer  Subjective/Objective Assessment:   adm w uremia, at fib w rvr     Action/Plan:   lives w wife, pcp dr Daphine Deutscher webb   Anticipated DC Date:  08/16/2011   Anticipated DC Plan:    In-house referral  Clinical Social Worker  Hospice / Palliative Care      DC Planning Services  CM consult      [facility] Choice  HOSPICE   Choice offered to / List presented to:             Status of service:  Completed, signed off Medicare Important Message given?   (If response is "NO", the following Medicare IM given date fields will be blank) Date Medicare IM given:   Date Additional Medicare IM given:    Discharge Disposition:  HOSPICE MEDICAL FACILITY  Per UR Regulation:  Reviewed for med. necessity/level of care/duration of stay  If discussed at Long Length of Stay Meetings, dates discussed:    Comments:  08/16/11 11:47 Letha Cape RN, BSN [phone] patient dc to [facility] today, CSW following.  5/30 11:21a debbie dowell rn,bsn [phone]

## 2011-08-11 NOTE — Progress Notes (Signed)
Subjective: Interval History: none.  Objective: Vital signs in last 24 hours: Temp:  [98.2 F (36.8 C)-98.6 F (37 C)] 98.5 F (36.9 C) (05/30 0652) Pulse Rate:  [77-141] 88  (05/30 0700) Resp:  [16-22] 19  (05/30 0700) BP: (144-203)/(45-95) 145/75 mmHg (05/30 0700) SpO2:  [91 %-100 %] 97 % (05/30 0700) Weight:  [86.2 kg (190 lb 0.6 oz)-86.3 kg (190 lb 4.1 oz)] 86.3 kg (190 lb 4.1 oz) (05/30 1610) Weight change:   Intake/Output from previous day: 05/29 0701 - 05/30 0700 In: 1.3 [I.V.:1.3] Out: 1316  Intake/Output this shift:    General appearance: combative and delirious Resp: diminished breath sounds bilaterally Cardio: irregularly irregular rhythm and systolic murmur: holosystolic 2/6, blowing at apex GI: pos bs, soft, liver down 4 cm Neurologic: Mental status: orientation: confused, agitated, tremulous, nonsense.  Lab Results:  [facility] 08/10/11 2224  WBC 12.6*  HGB 9.7*  HCT 30.1*  PLT 257   BMET:  Basename 08/10/11 2224  NA 129*  K 7.0*  CL 89*  CO2 23  GLUCOSE 118*  BUN 156*  CREATININE 7.05*  CALCIUM 10.6*   No results found for this basename: PTH:2 in the last 72 hours Iron Studies: No results found for this basename: IRON,TIBC,TRANSFERRIN,FERRITIN in the last 72 hours  Studies/Results: Dg Chest 2 View  08/10/2011  *RADIOLOGY REPORT*  Clinical Data: Shortness of breath.  CHEST - 2 VIEW  Comparison: 03/04/2011  Findings: Shallow inspiration.  Borderline heart size and pulmonary vascularity, likely normal for technique.  Calcified and ectatic aorta.  Prior right paratracheal soft tissues similar to previous study, likely representing vascular shadows.  No blunting of costophrenic angles.  No pneumothorax.  No focal airspace consolidation.  Degenerative changes in the spine.  IMPRESSION: Shallow inspiration accounts for prominent heart size and pulmonary vascularity.  Calcification and ectasia of the aorta.  No change since previous study.  Original Report  Authenticated By: Marlon Pel, M.D.   Ct Head Wo Contrast  08/11/2011  *RADIOLOGY REPORT*  Clinical Data: Altered mental status.  The patient is on Coumadin. Falls.  CT HEAD WITHOUT CONTRAST  Technique:  Contiguous axial images were obtained from the base of the skull through the vertex without contrast.  Comparison: 05/05/2007  Findings: Technically limited study due to motion artifact. Diffuse cerebral atrophy.  Ventricular dilatation consistent with central atrophy.  Low attenuation change in the deep white matter consistent with small vessel ischemia.  No mass effect or midline shift.  No abnormal extra-axial fluid collections.  Gray-white matter junctions are distinct.  Basal cisterns are not effaced. Vascular calcifications.  Visualized paranasal sinuses and mastoid air cells are not opacified.  No depressed skull fractures.  No significant changes since the previous study.  IMPRESSION: No acute intracranial abnormalities.  Chronic atrophy and small vessel ischemic changes.  Original Report Authenticated By: Marlon Pel, M.D.   Dg Knee Complete 4 Views Left  08/10/2011  *RADIOLOGY REPORT*  Clinical Data: Knee pain.  No known trauma.  LEFT KNEE - COMPLETE 4+ VIEW  Comparison: MRI left knee 11/24/2010  Findings: Mild diffuse bone demineralization.  Degenerative changes in the knee with tricompartmental narrowing and osteophytosis. There is a large left knee effusion.  No acute fracture or subluxation is suggested.  Old ununited ossicle over the anterior aspect of the proximal tibia.  Loose body is not excluded. Vascular calcifications.  IMPRESSION: Degenerative changes in the left knee.  Large left knee effusion. Old ununited ossicle demonstrated anterior to the tibia.  Loose body is not excluded.  Original Report Authenticated By: Marlon Pel, M.D.    I have reviewed the patient's current medications.  Assessment/Plan: 1 ESRD High BUN out of proportion to missing 1 HD, ? xs N2  intake, ? GIB.  Repeat chem at 1 pm 2 Confusion ? Meds, uremia, ? Dementia, ? Other R/o infx 3 Anemia follow 4 Knee arthritis 5 Nutrition P check labs, BC, sedation as needed , family counseled    LOS: 1 day   Noor Vidales L 08/11/2011,9:07 AM

## 2011-08-11 NOTE — ED Provider Notes (Signed)
History     CSN: [id number]  Arrival date & time 08/10/11  2216   First MD Initiated Contact with Patient 08/10/11 2343      Chief Complaint  Patient presents with  . Shortness of Breath  . Knee Pain    (Consider location/radiation/quality/duration/timing/severity/associated sxs/prior treatment) HPI History provided by patient and family bedside. No dialysis for the last week. Patient states he has been having ongoing left knee pain and it gives out when he tries to walk. He has fallen at home but denies pain related to that. He had called his dialysis center and arranged for dialysis today but again was unable to make it. Tonight he began having dyspnea especially with exertion and presents here for further evaluation. He has a history of severe arthritis in his left knee and was told by Dr. Magnus Ivan that he may require total knee replacement. 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 He has been having some increase bleeding 2 areas on his right arm he is scratched recently. He currently has Band-Aids over those areas without any active bleeding at this time. Shortness of breath is moderate in severity. He is requesting something stronger for pain in his left knee - he has been taking Percocet at home with minimal relief.  Past Medical History  Diagnosis Date  . Atrial fibrillation   . HTN (hypertension)   . GERD (gastroesophageal reflux disease)   . Depression   . Microcytic anemia   . Leukocytosis   . Hyponatremia   . Complication of anesthesia     hypotension after colonoscopy   . Shortness of breath     when walking   . Sleep apnea     new diagnosis CPAP since 1/13 settings 10   . Arthritis   . CHF (congestive heart failure)     diastolic heart failure   . Carotid artery stenosis     RICA 40-59% per OV note of Dr Shirlee Latch 2/13   . Pneumonia     adm 10/12 wtih pneumonia   . Aortic stenosis     mild  .  Aortic insufficiency     mild to moderate per note 2/13 of cardiologist   . Peritonitis     hx of with peritoneal dialysis 2009  . Hyperparathyroidism, secondary   . ESRD (end stage renal disease)     dialysis x 8 yrs   . Nephrolithiasis     hx of per office note of 2/13   . Esophageal dysmotility   . Dialysis patient     Left upper arm fistula, goes to [facility] in [place]     Past Surgical History  Procedure Date  . Other surgical history     left upper arm fistula  on peritoneal dialysis for first four years   . Other surgical history     surgery for peritoneal dialysis   . Knee arthroscopy 07/08/2011    Procedure: ARTHROSCOPY KNEE;  Surgeon: Kathryne Hitch, MD;  Location: [facility];  Service: Orthopedics;  Laterality: Left;  Left Knee Arthroscopy, Debridement and Synovectomy  . Synovectomy 07/08/2011    Procedure: SYNOVECTOMY;  Surgeon: Kathryne Hitch, MD;  Location: [facility];  Service: Orthopedics;  Laterality: Left;    Family History  Problem Relation Age of Onset  . Stroke    . Hypertension      History  Substance Use Topics  . Smoking status: Never  Smoker   . Smokeless tobacco: Never Used  . Alcohol Use: No      Review of Systems  Constitutional: Negative for fever and chills.  HENT: Negative for neck pain and neck stiffness.   Eyes: Negative for pain.  Respiratory: Positive for shortness of breath.   Cardiovascular: Negative for chest pain.  Gastrointestinal: Negative for abdominal pain.  Genitourinary: Negative for dysuria.  Musculoskeletal: Positive for joint swelling. Negative for back pain.  Skin: Negative for rash.  Neurological: Negative for headaches.  All other systems reviewed and are negative.    Allergies  Review of patient's allergies indicates no known allergies.  Home Medications   Current Outpatient Rx  Name Route Sig Dispense Refill  . ASPIRIN EC 81 MG PO TBEC Oral Take 81 mg by mouth every evening.    Marland Kitchen  NEPHRO-VITE 0.8 MG PO TABS Oral Take 0.8 mg by mouth at bedtime.     Marland Kitchen CALCIUM CITRATE-VITAMIN D 315-200 MG-UNIT PO TABS Oral Take 1 tablet by mouth 2 (two) times daily. Takes one with each meal    . DIPHENHYDRAMINE HCL 25 MG PO TABS Oral Take 25 mg by mouth daily as needed. For allergies    . FOSINOPRIL SODIUM 20 MG PO TABS Oral Take 10 mg by mouth every evening.     . MEGESTROL ACETATE 40 MG PO TABS Oral Take 40 mg by mouth daily with breakfast.     . METOPROLOL SUCCINATE ER 25 MG PO TB24 Oral Take 25 mg by mouth daily. Takes in evening    . OMEPRAZOLE 20 MG PO CPDR Oral Take 40 mg by mouth daily with breakfast.     . OXYCODONE HCL 5 MG PO CAPS Oral Take 5 mg by mouth every 6 (six) hours as needed.    . OXYCODONE-ACETAMINOPHEN 5-325 MG PO TABS Oral Take 1-2 tablets by mouth every 4 (four) hours as needed. For pain 60 tablet 0  . PAROXETINE HCL 30 MG PO TABS Oral Take 30 mg by mouth every evening.     Marland Kitchen PRAVASTATIN SODIUM 20 MG PO TABS Oral Take 1 tablet (20 mg total) by mouth every evening. 90 tablet 4  . PROBIOTIC PO Oral Take 1 tablet by mouth daily with breakfast.     . WARFARIN SODIUM 5 MG PO TABS Oral Take 2.5-5 mg by mouth every evening. Take 0.5 tablet on Mon and Fri, take 1 tablet the rest of the days.      BP 175/66  Pulse 81  Temp(Src) 98.6 F (37 C) (Oral)  Resp 20  SpO2 98%  Physical Exam  Constitutional: He is oriented to person, place, and time. He appears well-developed and well-nourished.  HENT:  Head: Normocephalic and atraumatic.  Eyes: Conjunctivae and EOM are normal. Pupils are equal, round, and reactive to light.  Neck: Trachea normal. Neck supple. No thyromegaly present.  Cardiovascular: Normal rate, regular rhythm, S1 normal, S2 normal and normal pulses.     No systolic murmur is present   No diastolic murmur is present  Pulses:      Radial pulses are 2+ on the right side, and 2+ on the left side.  Pulmonary/Chest: Effort normal. He has no rhonchi.        Mild bilateral rales without tachypnea or respiratory distress  Abdominal: Soft. Normal appearance and bowel sounds are normal. There is no tenderness. There is no CVA tenderness and negative Murphy's sign.  Musculoskeletal:       Left knee with moderate  to large effusion and diffuse tenderness over the patella. Decreased range of motion secondary to pain. Distal neurovascular otherwise intact. No erythema or increased warmth to touch.  Neurological: He is alert and oriented to person, place, and time. He has normal strength. No cranial nerve deficit or sensory deficit. GCS eye subscore is 4. GCS verbal subscore is 5. GCS motor subscore is 6.  Skin: Skin is warm and dry. No rash noted. He is not diaphoretic.  Psychiatric: His speech is normal.       Cooperative and appropriate    ED Course  Procedures (including critical care time)  Labs Reviewed  CBC - Abnormal; Notable for the following:    WBC 12.6 (*)    RBC 3.00 (*)    Hemoglobin 9.7 (*)    HCT 30.1 (*)    MCV 100.3 (*)    RDW 18.5 (*)    All other components within normal limits  DIFFERENTIAL - Abnormal; Notable for the following:    Neutro Abs 9.8 (*)    Lymphocytes Relative 8 (*)    Monocytes Relative 13 (*)    Monocytes Absolute 1.6 (*)    All other components within normal limits  COMPREHENSIVE METABOLIC PANEL - Abnormal; Notable for the following:    Sodium 129 (*)    Potassium 7.0 (*)    Chloride 89 (*)    Glucose, Bld 118 (*)    BUN 156 (*)    Creatinine, Ser 7.05 (*)    Calcium 10.6 (*)    Albumin 3.0 (*)    GFR calc non Af Amer 7 (*)    GFR calc Af Amer 8 (*)    All other components within normal limits  PROTIME-INR - Abnormal; Notable for the following:    Prothrombin Time 35.4 (*)    INR 3.47 (*)    All other components within normal limits   Dg Chest 2 View  08/10/2011  *RADIOLOGY REPORT*  Clinical Data: Shortness of breath.  CHEST - 2 VIEW  Comparison: 03/04/2011  Findings: Shallow inspiration.   Borderline heart size and pulmonary vascularity, likely normal for technique.  Calcified and ectatic aorta.  Prior right paratracheal soft tissues similar to previous study, likely representing vascular shadows.  No blunting of costophrenic angles.  No pneumothorax.  No focal airspace consolidation.  Degenerative changes in the spine.  IMPRESSION: Shallow inspiration accounts for prominent heart size and pulmonary vascularity.  Calcification and ectasia of the aorta.  No change since previous study.  Original Report Authenticated By: Marlon Pel, M.D.   Dg Knee Complete 4 Views Left  08/10/2011  *RADIOLOGY REPORT*  Clinical Data: Knee pain.  No known trauma.  LEFT KNEE - COMPLETE 4+ VIEW  Comparison: MRI left knee 11/24/2010  Findings: Mild diffuse bone demineralization.  Degenerative changes in the knee with tricompartmental narrowing and osteophytosis. There is a large left knee effusion.  No acute fracture or subluxation is suggested.  Old ununited ossicle over the anterior aspect of the proximal tibia.  Loose body is not excluded. Vascular calcifications.  IMPRESSION: Degenerative changes in the left knee.  Large left knee effusion. Old ununited ossicle demonstrated anterior to the tibia.  Loose body is not excluded.  Original Report Authenticated By: Marlon Pel, M.D.   X-rays and labs obtained and reviewed as above. For hyperkalemia, calcium, bicarbonate, insulin and D50 provided. Patient also given Kayexalate.  12:16 AM d/w Nephrology and Dr.Coladonato will see in the ED and write dialysis orders to  be done tonight  12:16 AM d/w MED - Dr Kaylyn Layer will admit.    MDM   End-stage renal dialysis patient on Coumadin, missed dialysis for the last week and now presenting with shortness of breath and hyperkalemia treated as above. Plan urgent dialysis. Patient also has left knee pain and swelling with elevated INR and likely hemarthrosis. Therapeutic arthrocentesis considered, but felt high  risk given INR over 3 and will be getting heparinized with dialysis tonight. May benefit from orthopedic evaluation as an inpatient for recommendations after urgent dialysis.        Sunnie Nielsen, MD 08/11/11 [phone]

## 2011-08-11 NOTE — ED Notes (Signed)
PT to CT.  RT to bedside for ABG when pt returns.  Pt will go to dialysis within 20 minutes.

## 2011-08-12 DIAGNOSIS — M79609 Pain in unspecified limb: Secondary | ICD-10-CM

## 2011-08-12 DIAGNOSIS — R197 Diarrhea, unspecified: Secondary | ICD-10-CM

## 2011-08-12 DIAGNOSIS — N186 End stage renal disease: Secondary | ICD-10-CM

## 2011-08-12 DIAGNOSIS — N19 Unspecified kidney failure: Secondary | ICD-10-CM

## 2011-08-12 DIAGNOSIS — R791 Abnormal coagulation profile: Secondary | ICD-10-CM

## 2011-08-12 LAB — CBC
HCT: 28.1 % — ABNORMAL LOW (ref 39.0–52.0)
MCH: 31.5 pg (ref 26.0–34.0)
MCV: 102.9 fL — ABNORMAL HIGH (ref 78.0–100.0)
Platelets: 260 10*3/uL (ref 150–400)
RDW: 18.9 % — ABNORMAL HIGH (ref 11.5–15.5)
WBC: 10.3 10*3/uL (ref 4.0–10.5)

## 2011-08-12 LAB — COMPREHENSIVE METABOLIC PANEL
ALT: 20 U/L (ref 0–53)
AST: 40 U/L — ABNORMAL HIGH (ref 0–37)
Albumin: 2.6 g/dL — ABNORMAL LOW (ref 3.5–5.2)
Alkaline Phosphatase: 90 U/L (ref 39–117)
Chloride: 94 mEq/L — ABNORMAL LOW (ref 96–112)
Potassium: 6.2 mEq/L — ABNORMAL HIGH (ref 3.5–5.1)
Sodium: 134 mEq/L — ABNORMAL LOW (ref 135–145)
Total Bilirubin: 0.5 mg/dL (ref 0.3–1.2)
Total Protein: 6.6 g/dL (ref 6.0–8.3)

## 2011-08-12 LAB — TSH: TSH: 2.401 u[IU]/mL (ref 0.350–4.500)

## 2011-08-12 MED ORDER — BOOST PLUS PO LIQD
237.0000 mL | Freq: Three times a day (TID) | ORAL | Status: DC
  Administered 2011-08-13 – 2011-08-16 (×6): 237 mL via ORAL
  Filled 2011-08-12 (×19): qty 237

## 2011-08-12 NOTE — Consult Note (Signed)
Consult Note from the Palliative Medicine Team at [facility] Patient ZO:XWRUEAV QUANDARIUS NILL      DOB: 08/19/1931      WUJ:[id number]   Consult Requested by: Dr. Briant Cedar     PCP: [name], MD, MD Reason for Consultation:GOC     Phone Number:[phone] Related symptom management Assessment and Plan: 78 year old white male with a known past medical history of ESRD on dialysis.  He was admitted with complaint of Left Knee pain but found to be in afib with RVR.  Course was complicated by altered mental status and as mentioned afib.  He has asked to discontinue dialysis in favor of comfort as he feels that his prognosis does not favor quality of life.  He had been thinking about this for the last year but did not act on the desire to stop dialysis .  He has multiple medical conditions which have decreased his quality of life.  His wife and children were at the bedside and have told Roston that they support whatever decision he makes.  His pain this afternoon was significant and at the time of this evaluation after being treated with dilaudid is doing much better. He relates that the dilaudid helped significantly.  He has discussed his code status with Dr. Benjamine Mola and elected a DNR status.  At this time he and his spouse desired to talk with Dr. Hyman Hopes in the AM about the details of Kyser' choices. 1. Code Status: DNR/DNI 2. Symptom Control: 1. Left Knee pain:  Agree with Dilaudid 0.5 mg q 2 hours prn as it is currently effective.  Fentanyl would also be reasonable as it would have less systemic toxicity.  Symptom management will  Need to match symptom progress and his family is aware that continuous opiates may be necessary to control symptoms at the end of life.  Patient appears to have capacity for choice. 2. Failure to Thrive/ decreased appetite: comfort feed whatever he desires. 3. ESRD: Patient states he would like to stop dialysis and pursue comfort care.  We will talk with Dr. Hyman Hopes in the am.   Family is prepared that he can decline at any time. 3. Psycho/Social:  This family has a complex history of showing love in a less than physical and verbal manner.  They were able to speak honestly with each other about past events and current feelings.  They need to have their space and need to deal with this time in their family as they have always dealt with each other.Will continue to try to facilitate conversations at this crucial time.  The children are rallying around their mother who is also has trouble expressing her true emotions. 4. Spiritual:  An element of faith is present but not prominent in his life.  Will make chaplains available . 5. Disposition:  Family updated on options for respecting Wenceslao' end of life wish.  We will outline the details in the am after speaking with Dr. Hyman Hopes.  Patient Documents Completed or Given: Document Given Completed  Advanced Directives Pkt    MOST    DNR    Gone from My Sight    Hard Choices      Brief HPI: 78 year old male with ESRD and chronic pain due to knee problem.  Patient presented with knee pain but was found in to be in afib. Patient has informed his care team and family that he does not want to go on with dialysis.  ROS:  Severe left knee  pain,  Failure to thrive. No chest pain or shortness of breath.    PMH:  Past Medical History  Diagnosis Date  . Paroxysmal atrial fibrillation     Initially diagnosed 08/2009  . HTN (hypertension)   . GERD (gastroesophageal reflux disease)   . Depression   . Microcytic anemia   . Leukocytosis   . Hyponatremia   . Complication of anesthesia     hypotension after colonoscopy   . Sleep apnea     new diagnosis CPAP since 1/13 settings 10   . Arthritis   . CHF (congestive heart failure)     diastolic heart failure   . Carotid artery stenosis     RICA 40-59% per OV note of Dr Shirlee Latch 2/13   . Pneumonia     adm 10/12 wtih pneumonia   . Aortic stenosis     mild to moderate per 2/13 echo  .  Aortic insufficiency     mild to moderate per note 2/13 of cardiologist   . Peritonitis     Had MRSE peritonitis with PD 2009, necessitating change to HD  . Hyperparathyroidism, secondary   . ESRD (end stage renal disease)     dialysis x 8 yrs. Has secondary hyperparathyroidism.  . Nephrolithiasis     hx of per office note of 2/13   . Esophageal dysmotility   . Dialysis patient     Left upper arm fistula, goes to [facility] in [place]   . Renal vascular disease     Occluded renal arteries bilaterally on cath 3/10.    . Carotid artery disease     40-59% RICA/0-39% LICA with ectatic, tortuous, redundant carotid arteries and subclavian arteries with at least mild plaque bilaterally (rec CTA but does not appear to have been done)  . CAD (coronary artery disease)     Nonobstructive 2010     PSH: Past Surgical History  Procedure Date  . Other surgical history     left upper arm fistula  on peritoneal dialysis for first four years   . Other surgical history     surgery for peritoneal dialysis   . Knee arthroscopy 07/08/2011    Procedure: ARTHROSCOPY KNEE;  Surgeon: Kathryne Hitch, MD;  Location: [facility];  Service: Orthopedics;  Laterality: Left;  Left Knee Arthroscopy, Debridement and Synovectomy  . Synovectomy 07/08/2011    Procedure: SYNOVECTOMY;  Surgeon: Kathryne Hitch, MD;  Location: [facility];  Service: Orthopedics;  Laterality: Left;   I have reviewed the FH and SH and  If appropriate update it with new information. No Known Allergies Scheduled Meds:   . amiodarone  400 mg Oral BID  . aspirin EC  81 mg Oral QPM  . darbepoetin (ARANESP) injection - DIALYSIS  100 mcg Intravenous Q Thu-HD  . docusate sodium  100 mg Oral BID  . lidocaine  1 patch Transdermal Q24H  . lisinopril  10 mg Oral QHS  . metoprolol      . metoprolol      . metoprolol tartrate  25 mg Oral Q6H  . multivitamin  1 tablet Oral QHS  . PARoxetine  30 mg Oral QPM  . senna  1 tablet Oral BID    . simvastatin  10 mg Oral q1800  . sodium chloride  3 mL Intravenous Q12H  . sodium chloride  3 mL Intravenous Q12H  . DISCONTD: lisinopril  20 mg Oral Daily  . DISCONTD: metoprolol  5 mg Intravenous Q6H  .  DISCONTD: multivitamin  1 tablet Oral Daily  . DISCONTD: Warfarin - Pharmacist Dosing Inpatient   Does not apply q1800   Continuous Infusions:   . DISCONTD: diltiazem (CARDIZEM) infusion 15 mg/hr (08/11/11 1144)   PRN Meds:.sodium chloride, sodium chloride, sodium chloride, acetaminophen, acetaminophen, alteplase, alteplase, feeding supplement (NEPRO CARB STEADY), heparin, heparin, heparin, heparin, HYDROcodone-acetaminophen, HYDROmorphone (DILAUDID) injection, lidocaine, lidocaine-prilocaine, ondansetron (ZOFRAN) IV, ondansetron, pentafluoroprop-tetrafluoroeth, polyethylene glycol, sodium chloride, DISCONTD: sodium chloride, DISCONTD: sodium chloride DISCONTD: feeding supplement (NEPRO CARB STEADY), DISCONTD: lidocaine, DISCONTD: lidocaine-prilocaine, DISCONTD: pentafluoroprop-tetrafluoroeth    BP 143/52  Pulse 70  Temp(Src) 98.6 F (37 C) (Oral)  Resp 15  Ht 5\' 7"  (1.702 m)  Wt 86.3 kg (190 lb 4.1 oz)  BMI 29.80 kg/m2  SpO2 92%   PPS:30%   Intake/Output Summary (Last 24 hours) at 08/12/11 0210 Last data filed at 08/12/11 0000  Gross per 24 hour  Intake 442.16 ml  Output   1516 ml  Net -1073.84 ml   LBM: None recorded                Stool Softner: Monitor  Physical Exam:  General: mild distress secondary to knee pain HEENT:  Perrl, eomi, anicteric, mmm,   Chest:  Decreased but clear CVS: regular rate and rhyth, S1, S2 Abdomen:soft , not distended Ext: did not exam knee due to pain Neuro: awake , alert , oriented to family and current issues  Labs: CBC    Component Value Date/Time   WBC 12.6* 08/10/2011 2224   RBC 3.00* 08/10/2011 2224   HGB 9.7* 08/10/2011 2224   HCT 30.1* 08/10/2011 2224   PLT 257 08/10/2011 2224   MCV 100.3* 08/10/2011 2224   MCH 32.3  08/10/2011 2224   MCHC 32.2 08/10/2011 2224   RDW 18.5* 08/10/2011 2224   LYMPHSABS 1.0 08/10/2011 2224   MONOABS 1.6* 08/10/2011 2224   EOSABS 0.2 08/10/2011 2224   BASOSABS 0.0 08/10/2011 2224      CMP     Component Value Date/Time   NA 129* 08/10/2011 2224   K 7.0* 08/10/2011 2224   CL 89* 08/10/2011 2224   CO2 23 08/10/2011 2224   GLUCOSE 118* 08/10/2011 2224   BUN 156* 08/10/2011 2224   CREATININE 7.05* 08/10/2011 2224   CALCIUM 10.6* 08/10/2011 2224   CALCIUM 8.1* 05/29/2008 0410   PROT 7.3 08/10/2011 2224   ALBUMIN 3.0* 08/10/2011 2224   AST 20 08/10/2011 2224   ALT 19 08/10/2011 2224   ALKPHOS 103 08/10/2011 2224   BILITOT 0.3 08/10/2011 2224   GFRNONAA 7* 08/10/2011 2224   GFRAA 8* 08/10/2011 2224    Chest Xray Reviewed/Impressions: no cardiopulmonary disease  Knee xray :  Large effusion.  Time In Time Out Total Time Spent with Patient Total Overall Time  600 pm 720 pm 60 min 80 min    Greater than 50%  of this time was spent counseling and coordinating care related to the above assessment and plan.   Daved Mcfann L. Ladona Ridgel, MD MBA The Palliative Medicine Team at [facility] Phone: [phone] Pager: [phone]

## 2011-08-12 NOTE — Progress Notes (Signed)
Physical Therapy Discharge Patient is being discharged from PT services secondary to:    Medical decline- patient has decided on comfort care and to stop dialysis.   08/12/2011 Veda Canning, PT Pager: [phone]

## 2011-08-12 NOTE — Progress Notes (Signed)
Patient YN:WGNFAOZ H Kiser      DOB: 11-Dec-1931      HYQ:[id number]   Palliative Medicine Team at [facility] Progress Note    Subjective:  Patient starting to decline.  Pain reasonable controlled.  He denies shortness of breath.  Interacting with his family intermittently.  Told him that I contacted Dr. Hyman Hopes and he would be by sometime today.  Spouse ok with offer choice for residential hospice.  Patient was able to tell his wife that he would marry her again and that he loved her-  Something that would not be easy for this stoic man to say.  They remain resolute about dc dialysis.  Filed Vitals:   08/12/11 0554  BP: 147/51  Pulse: 77  Temp:   Resp:    Physical exam: General: intermittently sleeping PERRL, EOMI,  mm dry Chest decreased anteriorly. CVS: regular ,  S1 and S2 slight murmur noted right 2nd ics Extremities with edema ,  Left knee swollen Neuro: slightly more impaired than yesterday.  Lab Results  Component Value Date   CREATININE 5.11* 08/12/2011   BUN 79* 08/12/2011   NA 134* 08/12/2011   K 6.2* 08/12/2011   CL 94* 08/12/2011   CO2 25 08/12/2011    Assessment and plan:  78 year old white male with endstage renal disease admitted with increasing knee pain which has been giving him trouble for several months and even required arthroscopy.  Patient was found to be in afib with rvr.  Patient has been declining significantly and has elected to stop dialysis.  Family rallying by him and allowing him to have his choice as hard as it is.    1.  DNR/DNI Comfort Care.  Family ok with transition to residential hospice if patient stable and bed available.  2.  Knee pain: continue Dilaudid as ordered.  3.  Anxiety : Can be addressed with benzodiazepines.  4. Anorexia/failure to thrive:  Comfort feed  5.  Disposition:  Residential Bed not available patient will likely have a hospital death.  Will titrate medications for symptom control.  Total Time : 20 min  Randie Tallarico L.  Ladona Ridgel, MD MBA The Palliative Medicine Team at [facility] Phone: [phone] Pager: [phone]

## 2011-08-12 NOTE — Progress Notes (Signed)
Patient ID: Casey [name], male   DOB: 1931/07/31, 78 y.o.   MRN: [id number]   SUBJECTIVE: Patient is alert. He is hard of hearing. There are family members in the room. I have reviewed the records carefully. Decision has been made for the patient to stop dialysis. He is off Cardizem. He is receiving by mouth amiodarone. His rhythm is stable. There is sinus rhythm.   Filed Vitals:   08/11/11 2230 08/12/11 0000 08/12/11 0400 08/12/11 0554  BP: 149/40 143/52 156/44 147/51  Pulse:  70 76 77  Temp:  98.6 F (37 C) 98.2 F (36.8 C)   TempSrc:  Oral Oral   Resp:  15 19   Height:      Weight:      SpO2:  92% 90%     Intake/Output Summary (Last 24 hours) at 08/12/11 0742 Last data filed at 08/12/11 0000  Gross per 24 hour  Intake 440.83 ml  Output    200 ml  Net 240.83 ml    LABS: Basic Metabolic Panel:  Basename 08/12/11 0536 08/10/11 2224  NA 134* 129*  K 6.2* 7.0*  CL 94* 89*  CO2 25 23  GLUCOSE 97 118*  BUN 79* 156*  CREATININE 5.11* 7.05*  CALCIUM 10.2 10.6*  MG -- --  PHOS -- --   Liver Function Tests:  Basename 08/12/11 0536 08/10/11 2224  AST 40* 20  ALT 20 19  ALKPHOS 90 103  BILITOT 0.5 0.3  PROT 6.6 7.3  ALBUMIN 2.6* 3.0*   No results found for this basename: LIPASE:2,AMYLASE:2 in the last 72 hours CBC:  Basename 08/12/11 0536 08/10/11 2224  WBC 10.3 12.6*  NEUTROABS -- 9.8*  HGB 8.6* 9.7*  HCT 28.1* 30.1*  MCV 102.9* 100.3*  PLT 260 257   Cardiac Enzymes:  Basename 08/11/11 0700  CKTOTAL 42  CKMB 2.7  CKMBINDEX --  TROPONINI <0.30   BNP: No components found with this basename: POCBNP:3 D-Dimer: No results found for this basename: DDIMER:2 in the last 72 hours Hemoglobin A1C: No results found for this basename: HGBA1C in the last 72 hours Fasting Lipid Panel: No results found for this basename: CHOL,HDL,LDLCALC,TRIG,CHOLHDL,LDLDIRECT in the last 72 hours Thyroid Function Tests:  Basename 08/11/11 1116  TSH 2.401  T4TOTAL --    T3FREE --  THYROIDAB --    RADIOLOGY: Dg Chest 2 View  08/10/2011  *RADIOLOGY REPORT*  Clinical Data: Shortness of breath.  CHEST - 2 VIEW  Comparison: 03/04/2011  Findings: Shallow inspiration.  Borderline heart size and pulmonary vascularity, likely normal for technique.  Calcified and ectatic aorta.  Prior right paratracheal soft tissues similar to previous study, likely representing vascular shadows.  No blunting of costophrenic angles.  No pneumothorax.  No focal airspace consolidation.  Degenerative changes in the spine.  IMPRESSION: Shallow inspiration accounts for prominent heart size and pulmonary vascularity.  Calcification and ectasia of the aorta.  No change since previous study.  Original Report Authenticated By: Marlon Pel, M.D.   Ct Head Wo Contrast  08/11/2011  *RADIOLOGY REPORT*  Clinical Data: Altered mental status.  The patient is on Coumadin. Falls.  CT HEAD WITHOUT CONTRAST  Technique:  Contiguous axial images were obtained from the base of the skull through the vertex without contrast.  Comparison: 05/05/2007  Findings: Technically limited study due to motion artifact. Diffuse cerebral atrophy.  Ventricular dilatation consistent with central atrophy.  Low attenuation change in the deep white matter consistent with small vessel ischemia.  No  mass effect or midline shift.  No abnormal extra-axial fluid collections.  Gray-white matter junctions are distinct.  Basal cisterns are not effaced. Vascular calcifications.  Visualized paranasal sinuses and mastoid air cells are not opacified.  No depressed skull fractures.  No significant changes since the previous study.  IMPRESSION: No acute intracranial abnormalities.  Chronic atrophy and small vessel ischemic changes.  Original Report Authenticated By: Marlon Pel, M.D.   Dg Knee Complete 4 Views Left  08/10/2011  *RADIOLOGY REPORT*  Clinical Data: Knee pain.  No known trauma.  LEFT KNEE - COMPLETE 4+ VIEW  Comparison: MRI  left knee 11/24/2010  Findings: Mild diffuse bone demineralization.  Degenerative changes in the knee with tricompartmental narrowing and osteophytosis. There is a large left knee effusion.  No acute fracture or subluxation is suggested.  Old ununited ossicle over the anterior aspect of the proximal tibia.  Loose body is not excluded. Vascular calcifications.  IMPRESSION: Degenerative changes in the left knee.  Large left knee effusion. Old ununited ossicle demonstrated anterior to the tibia.  Loose body is not excluded.  Original Report Authenticated By: Marlon Pel, M.D.    PHYSICAL EXAM    Hard of hearing.He is lying flat in bed. There is no respiratory distress. Lung exam reveals scattered rhonchi. Cardiac exam reveals S1 and S2. He has no significant peripheral edema.   TELEMETRY: I have reviewed telemetry. There is normal sinus rhythm.   ASSESSMENT AND PLAN:    Atrial fibrillation    Currently he is holding sinus rhythm. Dialysis is being stopped. Keeping him in sinus rhythm would probably help with his comfort level. It seems reasonable to continue amiodarone at this time until decisions are made to stop all by mouth medicines. Suggest continuing amiodarone at 400 mg by mouth twice a day for approximately 5 days. Then change it to 400 mg daily.  At this point it does not seem to be appropriate for cardiology to see the patient on a daily basis. We will sign off. Please call of further help is needed.     Willa Rough 08/12/2011 7:42 AM

## 2011-08-12 NOTE — Progress Notes (Addendum)
Subjective: Patient and family met with palliative care last night after deciding to stop dialysis- patient and wife would like to see Dr. Hyman Hopes today Pain in knee is better controlled    Objective: Vital signs in last 24 hours: Filed Vitals:   08/11/11 2230 08/12/11 0000 08/12/11 0400 08/12/11 0554  BP: 149/40 143/52 156/44 147/51  Pulse:  70 76 77  Temp:  98.6 F (37 C) 98.2 F (36.8 C)   TempSrc:  Oral Oral   Resp:  15 19   Height:      Weight:      SpO2:  92% 90%    Weight change:   Intake/Output Summary (Last 24 hours) at 08/12/11 0926 Last data filed at 08/12/11 0000  Gross per 24 hour  Intake 398.33 ml  Output    200 ml  Net 198.33 ml    Physical Exam: General: Awake, No acute distress. HEENT: EOMI. Neck: Supple CV: S1 and S2, rrr Lungs: no wheezing Abdomen: Soft, Nontender, Nondistended, +bowel sounds. Ext: Good pulses. Trace edema.   Lab Results:  [facility] 08/12/11 0536 08/10/11 2224  NA 134* 129*  K 6.2* 7.0*  CL 94* 89*  CO2 25 23  GLUCOSE 97 118*  BUN 79* 156*  CREATININE 5.11* 7.05*  CALCIUM 10.2 10.6*  MG -- --  PHOS -- --    Basename 08/12/11 0536 08/10/11 2224  AST 40* 20  ALT 20 19  ALKPHOS 90 103  BILITOT 0.5 0.3  PROT 6.6 7.3  ALBUMIN 2.6* 3.0*   No results found for this basename: LIPASE:2,AMYLASE:2 in the last 72 hours  Basename 08/12/11 0536 08/10/11 2224  WBC 10.3 12.6*  NEUTROABS -- 9.8*  HGB 8.6* 9.7*  HCT 28.1* 30.1*  MCV 102.9* 100.3*  PLT 260 257    Basename 08/11/11 0700  CKTOTAL 42  CKMB 2.7  CKMBINDEX --  TROPONINI <0.30   No components found with this basename: POCBNP:3 No results found for this basename: DDIMER:2 in the last 72 hours No results found for this basename: HGBA1C:2 in the last 72 hours No results found for this basename: CHOL:2,HDL:2,LDLCALC:2,TRIG:2,CHOLHDL:2,LDLDIRECT:2 in the last 72 hours  Basename 08/11/11 1116  TSH 2.401  T4TOTAL --  T3FREE --  THYROIDAB --   No results found  for this basename: VITAMINB12:2,FOLATE:2,FERRITIN:2,TIBC:2,IRON:2,RETICCTPCT:2 in the last 72 hours  Micro Results: Recent Results (from the past 240 hour(s))  MRSA PCR SCREENING     Status: Normal   Collection Time   08/11/11  6:57 AM      Component Value Range Status Comment   MRSA by PCR NEGATIVE  NEGATIVE  Final     Studies/Results: Dg Chest 2 View  08/10/2011  *RADIOLOGY REPORT*  Clinical Data: Shortness of breath.  CHEST - 2 VIEW  Comparison: 03/04/2011  Findings: Shallow inspiration.  Borderline heart size and pulmonary vascularity, likely normal for technique.  Calcified and ectatic aorta.  Prior right paratracheal soft tissues similar to previous study, likely representing vascular shadows.  No blunting of costophrenic angles.  No pneumothorax.  No focal airspace consolidation.  Degenerative changes in the spine.  IMPRESSION: Shallow inspiration accounts for prominent heart size and pulmonary vascularity.  Calcification and ectasia of the aorta.  No change since previous study.  Original Report Authenticated By: Marlon Pel, M.D.   Ct Head Wo Contrast  08/11/2011  *RADIOLOGY REPORT*  Clinical Data: Altered mental status.  The patient is on Coumadin. Falls.  CT HEAD WITHOUT CONTRAST  Technique:  Contiguous axial  images were obtained from the base of the skull through the vertex without contrast.  Comparison: 05/05/2007  Findings: Technically limited study due to motion artifact. Diffuse cerebral atrophy.  Ventricular dilatation consistent with central atrophy.  Low attenuation change in the deep white matter consistent with small vessel ischemia.  No mass effect or midline shift.  No abnormal extra-axial fluid collections.  Gray-white matter junctions are distinct.  Basal cisterns are not effaced. Vascular calcifications.  Visualized paranasal sinuses and mastoid air cells are not opacified.  No depressed skull fractures.  No significant changes since the previous study.  IMPRESSION: No  acute intracranial abnormalities.  Chronic atrophy and small vessel ischemic changes.  Original Report Authenticated By: Marlon Pel, M.D.   Dg Knee Complete 4 Views Left  08/10/2011  *RADIOLOGY REPORT*  Clinical Data: Knee pain.  No known trauma.  LEFT KNEE - COMPLETE 4+ VIEW  Comparison: MRI left knee 11/24/2010  Findings: Mild diffuse bone demineralization.  Degenerative changes in the knee with tricompartmental narrowing and osteophytosis. There is a large left knee effusion.  No acute fracture or subluxation is suggested.  Old ununited ossicle over the anterior aspect of the proximal tibia.  Loose body is not excluded. Vascular calcifications.  IMPRESSION: Degenerative changes in the left knee.  Large left knee effusion. Old ununited ossicle demonstrated anterior to the tibia.  Loose body is not excluded.  Original Report Authenticated By: Marlon Pel, M.D.    Medications: I have reviewed the patient's current medications. Scheduled Meds:   . amiodarone  400 mg Oral BID  . aspirin EC  81 mg Oral QPM  . darbepoetin (ARANESP) injection - DIALYSIS  100 mcg Intravenous Q Thu-HD  . docusate sodium  100 mg Oral BID  . lactose free nutrition  237 mL Oral TID WC  . lidocaine  1 patch Transdermal Q24H  . lisinopril  10 mg Oral QHS  . metoprolol      . metoprolol tartrate  25 mg Oral Q6H  . multivitamin  1 tablet Oral QHS  . PARoxetine  30 mg Oral QPM  . senna  1 tablet Oral BID  . simvastatin  10 mg Oral q1800  . sodium chloride  3 mL Intravenous Q12H  . sodium chloride  3 mL Intravenous Q12H  . DISCONTD: metoprolol  5 mg Intravenous Q6H  . DISCONTD: Warfarin - Pharmacist Dosing Inpatient   Does not apply q1800   Continuous Infusions:   . DISCONTD: diltiazem (CARDIZEM) infusion 15 mg/hr (08/11/11 1144)   PRN Meds:.sodium chloride, sodium chloride, sodium chloride, acetaminophen, acetaminophen, alteplase, alteplase, feeding supplement (NEPRO CARB STEADY), heparin, heparin,  heparin, heparin, HYDROcodone-acetaminophen, HYDROmorphone (DILAUDID) injection, lidocaine, lidocaine-prilocaine, ondansetron (ZOFRAN) IV, ondansetron, pentafluoroprop-tetrafluoroeth, polyethylene glycol, sodium chloride  Assessment/Plan:   Patient has decided to stop dialysis for his ESRD He converted back to NSR from a fib- given amiodarone- taken off coumadin  Palliative care following- ?D/C to inpatient hospice- await bed     LOS: 2 days  Merrell Borsuk, DO 08/12/2011, 9:26 AM

## 2011-08-12 NOTE — Progress Notes (Addendum)
Clinical Social Worker spoke with Casey [name] at [facility] and they do not have immediate bed availability. Family chose the [facility] in [place], [place] as second choice. CSW sent referral to [facility] and will await their response. CSW will continue to follow and update patient and family when facility gives a response.   15:23pm CSW received call from [facility] and they had one residential bed available. CSW updated family and they do not want to proceed with this facility. Wife prefers to wait for a bed at [facility] to become available. CSW updated Casey [name]. CSW will continue to follow.   Rozetta Nunnery MSW, [employer] [phone]

## 2011-08-12 NOTE — Progress Notes (Signed)
Patient ID: Casey [name], male   DOB: Jun 11, 1931, 78 y.o.   MRN: [id number] Patient has elected to stop dialysis.  Will support only and not follow formally.

## 2011-08-12 NOTE — Consult Note (Signed)
[facility] Liaison: Received request from CSW Cathlean Cower to follow up with family for interest in [facility]. Chart reviewed and met with daughter and son-in-law at bedside. Answered questioned related to [facility] and other end-of-life issues. Family working hard to make end-of-life arrangements. Family aware no availability today or over weekend. They have my card and contact number for questions specific to [facility]. Will follow up Monday. Appreciate follow up conversation with CSW. Thank you. Forrestine Him LCSW

## 2011-08-12 NOTE — Clinical Social Work Psychosocial (Signed)
     Clinical Social Work Department BRIEF PSYCHOSOCIAL ASSESSMENT 08/12/2011  Patient:  Casey [name], Casey [name]     Account Number:  [id number]     Admit date:  08/10/2011  Clinical Social Worker:  Hulan Fray  Date/Time:  08/12/2011 11:50 AM  Referred by:  Physician  Date Referred:  08/12/2011 Referred for  Residential hospice placement   Other Referral:   Interview type:  Family Other interview type:    PSYCHOSOCIAL DATA Living Status:  WIFE Admitted from facility:   Level of care:   Primary support name:  Harriett Sine Primary support relationship to patient:  SPOUSE Degree of support available:   supportive    CURRENT CONCERNS Current Concerns  Other - See comment   Other Concerns:   residential hospice    SOCIAL WORK ASSESSMENT / PLAN Clinical Social Worker received referral for residential hospice placement. Social Worker introduced self and explained reason for visit. CSW offered choice of residential hospice facilities. Family and patient are interested in "whatever the closest one is," which is Psychologist, sport and exercise. CSW left a voice message for Forrestine Him, liason for [employer] regarding choice for facility. CSW offered family emotional support and wife stated that her son in law is a Programmer, multimedia. CSW will continue to follow along.   Assessment/plan status:  Psychosocial Support/Ongoing Assessment of Needs Other assessment/ plan:   Information/referral to community resources:   List of Residential Hospice Facilities    PATIENTS/FAMILYS RESPONSE TO PLAN OF CARE: Patient and family appreciative of CSW's assistance with the residential hospice facilities.

## 2011-08-13 DIAGNOSIS — R197 Diarrhea, unspecified: Secondary | ICD-10-CM

## 2011-08-13 DIAGNOSIS — M79609 Pain in unspecified limb: Secondary | ICD-10-CM

## 2011-08-13 DIAGNOSIS — N186 End stage renal disease: Secondary | ICD-10-CM

## 2011-08-13 DIAGNOSIS — R791 Abnormal coagulation profile: Secondary | ICD-10-CM

## 2011-08-13 DIAGNOSIS — IMO0002 Reserved for concepts with insufficient information to code with codable children: Secondary | ICD-10-CM

## 2011-08-13 DIAGNOSIS — N19 Unspecified kidney failure: Secondary | ICD-10-CM

## 2011-08-13 DIAGNOSIS — L299 Pruritus, unspecified: Secondary | ICD-10-CM

## 2011-08-13 MED ORDER — DIPHENHYDRAMINE HCL 25 MG PO CAPS
25.0000 mg | ORAL_CAPSULE | Freq: Four times a day (QID) | ORAL | Status: DC | PRN
  Administered 2011-08-13: 25 mg via ORAL
  Filled 2011-08-13: qty 1

## 2011-08-13 MED ORDER — CAMPHOR-MENTHOL 0.5-0.5 % EX LOTN
TOPICAL_LOTION | CUTANEOUS | Status: DC | PRN
  Administered 2011-08-13: 1 via TOPICAL
  Administered 2011-08-13: 14:00:00 via TOPICAL
  Filled 2011-08-13: qty 222

## 2011-08-13 MED ORDER — HYDROMORPHONE HCL PF 1 MG/ML IJ SOLN
0.5000 mg | INTRAMUSCULAR | Status: DC | PRN
  Administered 2011-08-13 – 2011-08-14 (×3): 0.5 mg via INTRAVENOUS
  Filled 2011-08-13 (×4): qty 1

## 2011-08-13 MED ORDER — HYDROMORPHONE HCL PF 1 MG/ML IJ SOLN
0.5000 mg | INTRAMUSCULAR | Status: DC
  Administered 2011-08-13 – 2011-08-15 (×11): 0.5 mg via INTRAVENOUS
  Filled 2011-08-13 (×9): qty 1

## 2011-08-13 MED ORDER — DIPHENHYDRAMINE HCL 12.5 MG/5ML PO ELIX
12.5000 mg | ORAL_SOLUTION | Freq: Four times a day (QID) | ORAL | Status: DC | PRN
  Administered 2011-08-13 – 2011-08-16 (×5): 12.5 mg via ORAL
  Filled 2011-08-13 (×6): qty 5

## 2011-08-13 MED ORDER — LORAZEPAM 2 MG/ML IJ SOLN
0.5000 mg | INTRAMUSCULAR | Status: DC | PRN
  Filled 2011-08-13: qty 1

## 2011-08-13 NOTE — Progress Notes (Signed)
Subjective: Patient sleeping Family at bedside Pain medications adjusted    Objective: Vital signs in last 24 hours: Filed Vitals:   08/12/11 0554 08/12/11 1243 08/12/11 2133 08/13/11 0606  BP: 147/51 129/56 173/73 172/78  Pulse: 77 84 83 89  Temp:  98.6 F (37 C) 98.1 F (36.7 C) 98.2 F (36.8 C)  TempSrc:  Oral Oral Oral  Resp:  15 16 18   Height:      Weight:  86.637 kg (191 lb)    SpO2:  91% 95% 94%   Weight change:   Intake/Output Summary (Last 24 hours) at 08/13/11 1101 Last data filed at 08/13/11 1191  Gross per 24 hour  Intake      0 ml  Output    770 ml  Net   -770 ml    Physical Exam: General: Awake, No acute distress. HEENT: EOMI. Neck: Supple CV: S1 and S2, rrr Lungs: no wheezing Abdomen: Soft, Nontender, Nondistended, +bowel sounds. Ext: Good pulses. Trace edema.   Lab Results:  [facility] 08/12/11 0536 08/10/11 2224  NA 134* 129*  K 6.2* 7.0*  CL 94* 89*  CO2 25 23  GLUCOSE 97 118*  BUN 79* 156*  CREATININE 5.11* 7.05*  CALCIUM 10.2 10.6*  MG -- --  PHOS -- --    Basename 08/12/11 0536 08/10/11 2224  AST 40* 20  ALT 20 19  ALKPHOS 90 103  BILITOT 0.5 0.3  PROT 6.6 7.3  ALBUMIN 2.6* 3.0*   No results found for this basename: LIPASE:2,AMYLASE:2 in the last 72 hours  Basename 08/12/11 0536 08/10/11 2224  WBC 10.3 12.6*  NEUTROABS -- 9.8*  HGB 8.6* 9.7*  HCT 28.1* 30.1*  MCV 102.9* 100.3*  PLT 260 257    Basename 08/11/11 0700  CKTOTAL 42  CKMB 2.7  CKMBINDEX --  TROPONINI <0.30   No components found with this basename: POCBNP:3 No results found for this basename: DDIMER:2 in the last 72 hours No results found for this basename: HGBA1C:2 in the last 72 hours No results found for this basename: CHOL:2,HDL:2,LDLCALC:2,TRIG:2,CHOLHDL:2,LDLDIRECT:2 in the last 72 hours  Basename 08/11/11 1116  TSH 2.401  T4TOTAL --  T3FREE --  THYROIDAB --   No results found for this basename:  VITAMINB12:2,FOLATE:2,FERRITIN:2,TIBC:2,IRON:2,RETICCTPCT:2 in the last 72 hours  Micro Results: Recent Results (from the past 240 hour(s))  MRSA PCR SCREENING     Status: Normal   Collection Time   08/11/11  6:57 AM      Component Value Range Status Comment   MRSA by PCR NEGATIVE  NEGATIVE  Final   CULTURE, BLOOD (ROUTINE X 2)     Status: Normal (Preliminary result)   Collection Time   08/11/11 11:16 AM      Component Value Range Status Comment   Specimen Description BLOOD ARM RIGHT   Final    Special Requests BOTTLES DRAWN AEROBIC AND ANAEROBIC 10CC   Final    Culture  Setup Time [id number]   Final    Culture     Final    Value:        BLOOD CULTURE RECEIVED NO GROWTH TO DATE CULTURE WILL BE HELD FOR 5 DAYS BEFORE ISSUING A FINAL NEGATIVE REPORT   Report Status PENDING   Incomplete   CULTURE, BLOOD (ROUTINE X 2)     Status: Normal (Preliminary result)   Collection Time   08/11/11 11:27 AM      Component Value Range Status Comment   Specimen Description BLOOD HAND RIGHT  Final    Special Requests BOTTLES DRAWN AEROBIC ONLY [facility]   Final    Culture  Setup Time [id number]   Final    Culture     Final    Value:        BLOOD CULTURE RECEIVED NO GROWTH TO DATE CULTURE WILL BE HELD FOR 5 DAYS BEFORE ISSUING A FINAL NEGATIVE REPORT   Report Status PENDING   Incomplete     Studies/Results: No results found.  Medications: I have reviewed the patient's current medications. Scheduled Meds:    . amiodarone  400 mg Oral BID  . docusate sodium  100 mg Oral BID  .  HYDROmorphone (DILAUDID) injection  0.5 mg Intravenous Q4H  . lactose free nutrition  237 mL Oral TID WC  . lidocaine  1 patch Transdermal Q24H  . sodium chloride  3 mL Intravenous Q12H  . DISCONTD: aspirin EC  81 mg Oral QPM  . DISCONTD: darbepoetin (ARANESP) injection - DIALYSIS  100 mcg Intravenous Q Thu-HD  . DISCONTD: lisinopril  10 mg Oral QHS  . DISCONTD: metoprolol tartrate  25 mg Oral Q6H  . DISCONTD:  multivitamin  1 tablet Oral QHS  . DISCONTD: PARoxetine  30 mg Oral QPM  . DISCONTD: senna  1 tablet Oral BID  . DISCONTD: simvastatin  10 mg Oral q1800  . DISCONTD: sodium chloride  3 mL Intravenous Q12H   Continuous Infusions:  PRN Meds:.acetaminophen, acetaminophen, camphor-menthol, diphenhydrAMINE, HYDROmorphone (DILAUDID) injection, LORazepam, polyethylene glycol, sodium chloride, DISCONTD: sodium chloride, DISCONTD: sodium chloride, DISCONTD: sodium chloride, DISCONTD: diphenhydrAMINE, DISCONTD: feeding supplement (NEPRO CARB STEADY), DISCONTD: heparin, DISCONTD: heparin, DISCONTD: heparin, DISCONTD: heparin, DISCONTD: HYDROcodone-acetaminophen DISCONTD:  HYDROmorphone (DILAUDID) injection, DISCONTD: lidocaine, DISCONTD: lidocaine-prilocaine, DISCONTD: ondansetron (ZOFRAN) IV, DISCONTD: ondansetron, DISCONTD: pentafluoroprop-tetrafluoroeth  Assessment/Plan:   Patient has decided to stop dialysis for his ESRD He converted back to NSR from a fib- given amiodarone- taken off coumadin  Palliative care following- ?D/C to inpatient hospice- await bed     LOS: 3 days  Deema Juncaj, DO 08/13/2011, 11:01 AM

## 2011-08-13 NOTE — Progress Notes (Signed)
Patient NF:[id number] H Daily      DOB: [birth year]      VHQ:[id number]   Palliative Medicine Team at [facility] Progress Note  Subjective:  Patient not sleeping at night .  Spent the whole night chattering with his daughter about his past and and his family.  Daughter relates he is expressing unworthiness and seems unable to accept comfort and peace at this time in his life.  He is tangential in his speech this am.  He has periods of apnea and when he rouses he is agitated.  He is refusing to wear his CPAP.   Pain continue to be an issue but he is unable to ask for his medication due to confusion.  Filed Vitals:   08/13/11 0606  BP: 172/78  Pulse: 89  Temp: 98.2 F (36.8 C)  Resp: 18   Physical exam: Generally:  Confused with intermittent awareness,  Slightly agitated [place] , AT,  PERRL, EOMI , anicteric, MM dry Chest :  Decreased but clear no rhonchi CVS:  Regular, S1, and S2   I-II/VI SEM Abd: obese soft, not tender, positive bowel sounds Ext: Left knee warm but not erythematous, swollen,  Multiple lesions Neuro:  Alternating confusion, sarcasm,  Intermittent sleep with agitation on waking.   Assessment and plan: 78 year old white male with ESRD admitted with afib and worsening left knee pain.  Patient has been on a declining course and has elected to stop dialysis.  Patient becoming more agitated.  1.  DNR/DNI  Comfort care 2.  Pain:  Patient having difficulty objectively requesting treatment.  Will change to scheduled doing small amounts with PRN meds for break through. 3.  Itching:  Secondary to missing dialysis.  Try to use topical meds.  Benadryl ok but may increase his level of confusion will reevaluate if that is the case. 4.  Anxiety /Agitation: can try low dose prn ativan .  Watch for paradoxical reaction.  May need to add haldol if agitation escalates.  5.  Disposition:  Patient on the waiting list for Residential hospice placement.  Time In Time Out Total Time Spent with  Patient Total Overall Time  845 am 915 am 20 min 20 min

## 2011-08-13 NOTE — Progress Notes (Signed)
This visit was in response to a request from Dr. Ladona Ridgel.  Pt was asleep when I entered the room.  Wife, daughter and son were at bedside.  There was a pastor also present.  Family stated that pt had spent all night talking about the past, confessing, and stating his concern for his family.  Family feels pt is at peace and is thankful that he had some control about his medical care and decisions.  Harriett Sine, pt's wife, stated she was at peace with everything.  Pt's son, Virl Diamond, was thankful that his father was at peace and that the pt had the power to make decisions concerning his care.  I offered emotional and spiritual support. Family was grateful for the visit. Please page me if further assistance is needed.  Dellie Catholic  [phone]  oncall pager  08/13/11 1042  Clinical Encounter Type  Visited With Patient and family together  Visit Type Follow-up  Referral From Palliative care team  Spiritual Encounters  Spiritual Needs Emotional;Grief support  Stress Factors  Patient Stress Factors Major life changes  Family Stress Factors Major life changes;Loss of control;Family relationships;[employer]  [phone]

## 2011-08-14 DIAGNOSIS — N19 Unspecified kidney failure: Secondary | ICD-10-CM

## 2011-08-14 DIAGNOSIS — N186 End stage renal disease: Secondary | ICD-10-CM

## 2011-08-14 DIAGNOSIS — R197 Diarrhea, unspecified: Secondary | ICD-10-CM

## 2011-08-14 NOTE — Progress Notes (Signed)
Subjective: Patient sleeping Family at bedside   Objective: Vital signs in last 24 hours: Filed Vitals:   08/13/11 0606 08/13/11 1500 08/13/11 2100 08/14/11 0652  BP: 172/78 170/66 186/68 159/65  Pulse: 89 80 74 81  Temp: 98.2 F (36.8 C) 98.4 F (36.9 C) 97.8 F (36.6 C) 97.7 F (36.5 C)  TempSrc: Oral Oral Oral Oral  Resp: 18 18 16 18   Height:      Weight:      SpO2: 94% 95% 94% 94%   Weight change:   Intake/Output Summary (Last 24 hours) at 08/14/11 1136 Last data filed at 08/14/11 0653  Gross per 24 hour  Intake    240 ml  Output    350 ml  Net   -110 ml    Physical Exam: General: sleeping, No acute distress. Family at bedside  Lab Results:  [facility] 08/12/11 0536  NA 134*  K 6.2*  CL 94*  CO2 25  GLUCOSE 97  BUN 79*  CREATININE 5.11*  CALCIUM 10.2  MG --  PHOS --    Basename 08/12/11 0536  AST 40*  ALT 20  ALKPHOS 90  BILITOT 0.5  PROT 6.6  ALBUMIN 2.6*   No results found for this basename: LIPASE:2,AMYLASE:2 in the last 72 hours  Basename 08/12/11 0536  WBC 10.3  NEUTROABS --  HGB 8.6*  HCT 28.1*  MCV 102.9*  PLT 260   No results found for this basename: CKTOTAL:3,CKMB:3,CKMBINDEX:3,TROPONINI:3 in the last 72 hours No components found with this basename: POCBNP:3 No results found for this basename: DDIMER:2 in the last 72 hours No results found for this basename: HGBA1C:2 in the last 72 hours No results found for this basename: CHOL:2,HDL:2,LDLCALC:2,TRIG:2,CHOLHDL:2,LDLDIRECT:2 in the last 72 hours No results found for this basename: TSH,T4TOTAL,FREET3,T3FREE,THYROIDAB in the last 72 hours No results found for this basename: VITAMINB12:2,FOLATE:2,FERRITIN:2,TIBC:2,IRON:2,RETICCTPCT:2 in the last 72 hours  Micro Results: Recent Results (from the past 240 hour(s))  MRSA PCR SCREENING     Status: Normal   Collection Time   08/11/11  6:57 AM      Component Value Range Status Comment   MRSA by PCR NEGATIVE  NEGATIVE  Final     CULTURE, BLOOD (ROUTINE X 2)     Status: Normal (Preliminary result)   Collection Time   08/11/11 11:16 AM      Component Value Range Status Comment   Specimen Description BLOOD ARM RIGHT   Final    Special Requests BOTTLES DRAWN AEROBIC AND ANAEROBIC 10CC   Final    Culture  Setup Time [id number]   Final    Culture     Final    Value:        BLOOD CULTURE RECEIVED NO GROWTH TO DATE CULTURE WILL BE HELD FOR 5 DAYS BEFORE ISSUING A FINAL NEGATIVE REPORT   Report Status PENDING   Incomplete   CULTURE, BLOOD (ROUTINE X 2)     Status: Normal (Preliminary result)   Collection Time   08/11/11 11:27 AM      Component Value Range Status Comment   Specimen Description BLOOD HAND RIGHT   Final    Special Requests BOTTLES DRAWN AEROBIC ONLY [facility]   Final    Culture  Setup Time [id number]   Final    Culture     Final    Value:        BLOOD CULTURE RECEIVED NO GROWTH TO DATE CULTURE WILL BE HELD FOR 5 DAYS BEFORE ISSUING A FINAL NEGATIVE REPORT  Report Status PENDING   Incomplete     Studies/Results: No results found.  Medications: I have reviewed the patient's current medications. Scheduled Meds:    . amiodarone  400 mg Oral BID  . docusate sodium  100 mg Oral BID  .  HYDROmorphone (DILAUDID) injection  0.5 mg Intravenous Q4H  . lactose free nutrition  237 mL Oral TID WC  . lidocaine  1 patch Transdermal Q24H  . sodium chloride  3 mL Intravenous Q12H   Continuous Infusions:  PRN Meds:.acetaminophen, acetaminophen, camphor-menthol, diphenhydrAMINE, HYDROmorphone (DILAUDID) injection, LORazepam, polyethylene glycol, sodium chloride  Assessment/Plan:   Patient has decided to stop dialysis for his ESRD He converted back to NSR from a fib- given amiodarone- taken off coumadin  Palliative care following- ?D/C to inpatient hospice- await bed     LOS: 4 days  Casey Einstein, DO 08/14/2011, 11:36 AM

## 2011-08-15 DIAGNOSIS — R791 Abnormal coagulation profile: Secondary | ICD-10-CM

## 2011-08-15 DIAGNOSIS — N186 End stage renal disease: Secondary | ICD-10-CM

## 2011-08-15 DIAGNOSIS — R197 Diarrhea, unspecified: Secondary | ICD-10-CM

## 2011-08-15 DIAGNOSIS — N19 Unspecified kidney failure: Secondary | ICD-10-CM

## 2011-08-15 MED ORDER — LORAZEPAM 2 MG/ML IJ SOLN: 0.5000 mg | INTRAMUSCULAR | Status: DC | PRN

## 2011-08-15 MED ORDER — PANTOPRAZOLE SODIUM 40 MG PO TBEC
40.0000 mg | DELAYED_RELEASE_TABLET | Freq: Every day | ORAL | Status: DC
  Administered 2011-08-15: 40 mg via ORAL
  Filled 2011-08-15: qty 1

## 2011-08-15 MED ORDER — POLYETHYLENE GLYCOL 3350 17 G PO PACK: 17.0000 g | PACK | Freq: Every day | ORAL | Status: AC | PRN

## 2011-08-15 MED ORDER — OXYCODONE HCL 20 MG/ML PO CONC
5.0000 mg | ORAL | Status: DC
  Administered 2011-08-15 – 2011-08-16 (×6): 5 mg via ORAL
  Filled 2011-08-15 (×6): qty 1

## 2011-08-15 MED ORDER — CALCIUM CARBONATE ANTACID 500 MG PO CHEW
1.0000 | CHEWABLE_TABLET | Freq: Two times a day (BID) | ORAL | Status: DC
  Administered 2011-08-15 – 2011-08-16 (×2): 200 mg via ORAL
  Filled 2011-08-15 (×3): qty 1

## 2011-08-15 MED ORDER — WHITE PETROLATUM GEL
Status: AC
  Filled 2011-08-15: qty 5

## 2011-08-15 MED ORDER — BOOST PLUS PO LIQD: 237.0000 mL | Freq: Three times a day (TID) | ORAL | Status: AC

## 2011-08-15 MED ORDER — LORAZEPAM 2 MG/ML IJ SOLN
0.5000 mg | Freq: Every day | INTRAMUSCULAR | Status: DC
  Administered 2011-08-15: 0.5 mg via INTRAVENOUS

## 2011-08-15 MED ORDER — AMIODARONE HCL 400 MG PO TABS: 400.0000 mg | ORAL_TABLET | Freq: Two times a day (BID) | ORAL | Status: AC

## 2011-08-15 MED ORDER — CAMPHOR-MENTHOL 0.5-0.5 % EX LOTN: TOPICAL_LOTION | CUTANEOUS | Status: AC | PRN

## 2011-08-15 MED ORDER — LIDOCAINE 5 % EX PTCH: 1.0000 | MEDICATED_PATCH | CUTANEOUS | Status: DC

## 2011-08-15 MED ORDER — HYDROMORPHONE HCL PF 1 MG/ML IJ SOLN: 0.5000 mg | INTRAMUSCULAR | Status: DC | PRN

## 2011-08-15 MED ORDER — HYDROMORPHONE HCL PF 1 MG/ML IJ SOLN: 0.5000 mg | INTRAMUSCULAR | Status: DC

## 2011-08-15 MED ORDER — HYDROCORTISONE 0.5 % EX CREA
TOPICAL_CREAM | Freq: Two times a day (BID) | CUTANEOUS | Status: DC
  Administered 2011-08-15 – 2011-08-16 (×3): via TOPICAL
  Filled 2011-08-15 (×2): qty 28.35

## 2011-08-15 NOTE — Consult Note (Signed)
[facility] Liaison: [employer] available for Mr. Abdou tomorrow. CSW aware. Paperwork has been completed for transfer. Await response from Dr. Hyman Hopes as to if he will be attending. Please fax discharge summary to [phone] and RN call report to [phone]. Thank you. Forrestine Him LCSW [phone]

## 2011-08-15 NOTE — Progress Notes (Signed)
Patient MW:[id number] H Kindel      DOB: August 16, 1931      NUU:[id number]  Patient comfortable. Pain controlled. Offered emotional support to family.   Shyrl Obi L. Ladona Ridgel, MD MBA The Palliative Medicine Team at [facility] Phone: [phone] Pager: [phone]

## 2011-08-15 NOTE — Progress Notes (Signed)
Subjective: Patient NAD Family at bedside   Objective: Vital signs in last 24 hours: Filed Vitals:   08/14/11 0652 08/14/11 1300 08/14/11 2103 08/15/11 0900  BP: 159/65 160/66 172/76 124/64  Pulse: 81 80 82 80  Temp: 97.7 F (36.5 C) 98.4 F (36.9 C) 98.3 F (36.8 C)   TempSrc: Oral Oral Oral   Resp: 18 18 18    Height:      Weight:      SpO2: 94% 95% 96%    Weight change:   Intake/Output Summary (Last 24 hours) at 08/15/11 0941 Last data filed at 08/14/11 1300  Gross per 24 hour  Intake    120 ml  Output    200 ml  Net    -80 ml    Physical Exam: General: sleeping, No acute distress. Family at bedside  Lab Results: No results found for this basename: NA:2,K:2,CL:2,CO2:2,GLUCOSE:2,BUN:2,CREATININE:2,CALCIUM:2,MG:2,PHOS:2 in the last 72 hours No results found for this basename: AST:2,ALT:2,ALKPHOS:2,BILITOT:2,PROT:2,ALBUMIN:2 in the last 72 hours No results found for this basename: LIPASE:2,AMYLASE:2 in the last 72 hours No results found for this basename: WBC:2,NEUTROABS:2,HGB:2,HCT:2,MCV:2,PLT:2 in the last 72 hours No results found for this basename: CKTOTAL:3,CKMB:3,CKMBINDEX:3,TROPONINI:3 in the last 72 hours No components found with this basename: POCBNP:3 No results found for this basename: DDIMER:2 in the last 72 hours No results found for this basename: HGBA1C:2 in the last 72 hours No results found for this basename: CHOL:2,HDL:2,LDLCALC:2,TRIG:2,CHOLHDL:2,LDLDIRECT:2 in the last 72 hours No results found for this basename: TSH,T4TOTAL,FREET3,T3FREE,THYROIDAB in the last 72 hours No results found for this basename: VITAMINB12:2,FOLATE:2,FERRITIN:2,TIBC:2,IRON:2,RETICCTPCT:2 in the last 72 hours  Micro Results: Recent Results (from the past 240 hour(s))  MRSA PCR SCREENING     Status: Normal   Collection Time   08/11/11  6:57 AM      Component Value Range Status Comment   MRSA by PCR NEGATIVE  NEGATIVE  Final   CULTURE, BLOOD (ROUTINE X 2)     Status:  Normal (Preliminary result)   Collection Time   08/11/11 11:16 AM      Component Value Range Status Comment   Specimen Description BLOOD ARM RIGHT   Final    Special Requests BOTTLES DRAWN AEROBIC AND ANAEROBIC 10CC   Final    Culture  Setup Time [id number]   Final    Culture     Final    Value:        BLOOD CULTURE RECEIVED NO GROWTH TO DATE CULTURE WILL BE HELD FOR 5 DAYS BEFORE ISSUING A FINAL NEGATIVE REPORT   Report Status PENDING   Incomplete   CULTURE, BLOOD (ROUTINE X 2)     Status: Normal (Preliminary result)   Collection Time   08/11/11 11:27 AM      Component Value Range Status Comment   Specimen Description BLOOD HAND RIGHT   Final    Special Requests BOTTLES DRAWN AEROBIC ONLY [facility]   Final    Culture  Setup Time [id number]   Final    Culture     Final    Value:        BLOOD CULTURE RECEIVED NO GROWTH TO DATE CULTURE WILL BE HELD FOR 5 DAYS BEFORE ISSUING A FINAL NEGATIVE REPORT   Report Status PENDING   Incomplete     Studies/Results: No results found.  Medications: I have reviewed the patient's current medications. Scheduled Meds:    . amiodarone  400 mg Oral BID  . docusate sodium  100 mg Oral BID  .  HYDROmorphone (DILAUDID)  injection  0.5 mg Intravenous Q4H  . lactose free nutrition  237 mL Oral TID WC  . lidocaine  1 patch Transdermal Q24H  . sodium chloride  3 mL Intravenous Q12H  . white petrolatum       Continuous Infusions:  PRN Meds:.acetaminophen, acetaminophen, camphor-menthol, diphenhydrAMINE, HYDROmorphone (DILAUDID) injection, LORazepam, polyethylene glycol, sodium chloride  Assessment/Plan:   Patient has decided to stop dialysis for his ESRD He converted back to NSR from a fib- given amiodarone- taken off coumadin  Palliative care following- ?D/C to inpatient hospice- await bed     LOS: 5 days  Glennis Borger, DO 08/15/2011, 9:41 AM

## 2011-08-15 NOTE — Progress Notes (Signed)
Clinical Child psychotherapist received notification from Forrestine Him, [facility] liaison that bed available at [facility]. Clinical Social Worker notified MD. MD inquiring how [facility] manages IV pain medication. Clinical Social Worker clarified with [facility] liaison, Forrestine Him and pt must have PICC line if plan is to discharge on IV pain medication. Clinical Social Worker notified MD. Clinical Social Worker met with pt and multiple families at bedside. Clinical Child psychotherapist introduced self and role of unit Visual merchandiser. Pt reports that he is "comfortable" and pt appreciative of visit. Clinical Child psychotherapist met with pt son alone in hallway and clarified pt son questions in regard to process of transfer to [facility]. Clinical Social Worker to facilitate pt discharge needs to [facility] 08/16/11.  Jacklynn Lewis, MSW, LCSWA  Clinical Social Work [phone]

## 2011-08-15 NOTE — Discharge Summary (Addendum)
Discharge Summary  Casey [name] MR#: [id number]  DOB:18-Mar-1931  Date of Admission: 08/10/2011 Date of Discharge: 08/16/2011  Patient's PCP: [name], MD, MD  Attending Physician:Marcena Dias  Consults: Treatment Team:  Irena Cords, MD Palliative [facility]   Discharge Diagnoses: Active Problems:  ANEMIA  Atrial fibrillation  Synovitis of knee  ESRD (end stage renal disease)  Leukocytosis  Encephalopathy   Brief Admitting History and Physical 80yOM with h/o ESRD on HD, aortic stenosis and insufficiency, Afib, orthostatic hypoTN, dCHF, and  chronic left knee effusions who presents with persistent left knee pain leading to missed HD sessions.  He cannot give history, as he seems quite obtunded, only able to awaken very briefly and then dozes  back off, cannot carry conversation, and no family now present. But in speaking with ED staff, it seems  pt was more conversant and alert earlier, able to converse fully but a bit off on details, and  apparently [place] also had long convo with him and family.  His story is that he has worsening left knee pain and missed HD for the past 2 sessions. Pt last saw  Dr. Magnus Ivan in ortho 06/2011 who notes multiple recurrent effusions unresponsive to aspirations and  injections. He was taken to OR for left knee arthroscopy, debridement and partial synovectomy on 4/26.  He can state that the knee is not more swollen or worse than when he had the procedure. He was brought  to the ED for increased weakness, a fall today.  In the ED, vitals stable except HTN up to 203/67. Labs with hypoNa 129, hyperK 7.0, HCO3 23, renal  156/7.05. WBC 12.6, Hgb 9.7. INR was high at 3.47. CT head negative for acute. CXR without acute  change. Left knee plain film with large left knee effusion, cannot rule out loose body. Pt was given Ca  gluconate, 50 cc dextrose, 8u novolog, 1mg  dilaudid, 4mg  zofran, Na bicarb, kayexalate.  Dr. Arrie Aran saw him  and pt is currently in HD. He cannot give ROS as above, but denies there's  anything wrong. He endorses feeling tired and sleepy when I ask him what's wrong.      Discharge Medications Medication List  As of 08/16/2011  9:26 AM   STOP taking these medications         aspirin EC 81 MG tablet      b complex-vitamin c-folic acid 0.8 MG Tabs      calcium citrate-vitamin D 315-200 MG-UNIT per tablet      fosinopril 20 MG tablet      megestrol 40 MG tablet      metoprolol succinate 25 MG 24 hr tablet      omeprazole 20 MG capsule      oxycodone 5 MG capsule      oxyCODONE-acetaminophen 5-325 MG per tablet      PARoxetine 30 MG tablet      pravastatin 20 MG tablet      PROBIOTIC PO      warfarin 5 MG tablet         TAKE these medications         amiodarone 400 MG tablet   Commonly known as: PACERONE   Take 1 tablet (400 mg total) by mouth 2 (two) times daily. 400 mg BID til 6/5 and then 400 mg daily      calcium carbonate 500 MG chewable tablet   Commonly known as: TUMS - dosed in mg elemental calcium   Chew 1 tablet (  200 mg of elemental calcium total) by mouth 2 (two) times daily.      camphor-menthol lotion   Commonly known as: SARNA   Apply topically as needed for itching.      diphenhydrAMINE 25 MG tablet   Commonly known as: BENADRYL   Take 25 mg by mouth daily as needed. For allergies      hydrocortisone cream 0.5 %   Apply topically 2 (two) times daily.      lactose free nutrition Liqd   Take 237 mLs by mouth 3 (three) times daily with meals.      lidocaine 5 %   Commonly known as: LIDODERM   Place 1 patch onto the skin daily. Remove & Discard patch within 12 hours or as directed by MD      LORazepam 0.5 MG tablet   Commonly known as: ATIVAN   Take 1 tablet (0.5 mg total) by mouth 4 (four) times daily as needed (aggitation).      oxyCODONE 20 MG/ML concentrated solution   Commonly known as: ROXICODONE INTENSOL   Take 0.3 mLs (6 mg total) by mouth  every 2 (two) hours.      pantoprazole 40 MG tablet   Commonly known as: PROTONIX   Take 1 tablet (40 mg total) by mouth daily at 12 noon.      polyethylene glycol packet   Commonly known as: MIRALAX / GLYCOLAX   Take 17 g by mouth daily as needed.            Hospital Course: Patient was admitted with missed HD sessions secondary to knee pain.  In hospital HD was attempted but patient developed a fib with RVR.  Patient and family elected to D/C dialysis and go to a residential hospice placement.  Pain controlled with PO pain meds.  PRN ativan for aggitation.  Continue amiodarone BID til 6/5 and then daily to maintain sinus rhythm.       Day of Discharge BP 164/68  Pulse 80  Temp(Src) 98.4 F (36.9 C) (Oral)  Resp 20  Ht 5\' 7"  (1.702 m)  Wt 86.637 kg (191 lb)  BMI 29.91 kg/m2  SpO2 95% Resting in bed, family at bedside  No results found for this or any previous visit (from the past 48 hour(s)).  Dg Chest 2 View  08/10/2011  *RADIOLOGY REPORT*  Clinical Data: Shortness of breath.  CHEST - 2 VIEW  Comparison: 03/04/2011  Findings: Shallow inspiration.  Borderline heart size and pulmonary vascularity, likely normal for technique.  Calcified and ectatic aorta.  Prior right paratracheal soft tissues similar to previous study, likely representing vascular shadows.  No blunting of costophrenic angles.  No pneumothorax.  No focal airspace consolidation.  Degenerative changes in the spine.  IMPRESSION: Shallow inspiration accounts for prominent heart size and pulmonary vascularity.  Calcification and ectasia of the aorta.  No change since previous study.  Original Report Authenticated By: Marlon Pel, M.D.   Ct Head Wo Contrast  08/11/2011  *RADIOLOGY REPORT*  Clinical Data: Altered mental status.  The patient is on Coumadin. Falls.  CT HEAD WITHOUT CONTRAST  Technique:  Contiguous axial images were obtained from the base of the skull through the vertex without contrast.   Comparison: 05/05/2007  Findings: Technically limited study due to motion artifact. Diffuse cerebral atrophy.  Ventricular dilatation consistent with central atrophy.  Low attenuation change in the deep white matter consistent with small vessel ischemia.  No mass effect or midline shift.  No  abnormal extra-axial fluid collections.  Gray-white matter junctions are distinct.  Basal cisterns are not effaced. Vascular calcifications.  Visualized paranasal sinuses and mastoid air cells are not opacified.  No depressed skull fractures.  No significant changes since the previous study.  IMPRESSION: No acute intracranial abnormalities.  Chronic atrophy and small vessel ischemic changes.  Original Report Authenticated By: Marlon Pel, M.D.   Dg Knee Complete 4 Views Left  08/10/2011  *RADIOLOGY REPORT*  Clinical Data: Knee pain.  No known trauma.  LEFT KNEE - COMPLETE 4+ VIEW  Comparison: MRI left knee 11/24/2010  Findings: Mild diffuse bone demineralization.  Degenerative changes in the knee with tricompartmental narrowing and osteophytosis. There is a large left knee effusion.  No acute fracture or subluxation is suggested.  Old ununited ossicle over the anterior aspect of the proximal tibia.  Loose body is not excluded. Vascular calcifications.  IMPRESSION: Degenerative changes in the left knee.  Large left knee effusion. Old ununited ossicle demonstrated anterior to the tibia.  Loose body is not excluded.  Original Report Authenticated By: Marlon Pel, M.D.     Disposition: [facility]  Diet: as tolerated  Activity: as tolerated   Follow-up Appts: Discharge Orders    Future Orders Please Complete By Expires   Diet general      Increase activity slowly      Discharge instructions      Comments:   Hold oxy IR if sleeping or sedated       Time spent on discharge, talking to the patient, and coordinating care: 45 mins.   SignedMarlin Canary, DO 08/16/2011, 9:26 AM

## 2011-08-15 NOTE — Progress Notes (Signed)
Noted moving to [facility] in am.  Will convert scheduled opiate to OxyIR q 2hours hold if asleep or sedated and would recommend prn ativan 0.5 mg q 4 hours prn for agitation.  Addendum: rechecked after initiating OxyIR doing well no pain,  Just itchy.  Reassured daughters of events of tomorrow and ability to medicate without PICC line.  Casey [name] L. Ladona Ridgel, MD MBA The Palliative Medicine Team at [facility] Phone: [phone] Pager: [phone]

## 2011-08-15 NOTE — Consult Note (Signed)
[facility] Liaison: No [facility] availability today. Will follow up with family and CSW this morning. Thank you. Forrestine Him LCSW [phone]

## 2011-08-15 NOTE — Progress Notes (Signed)
Patient [id number] H Smalling      DOB: 25-Feb-1932      ONG:[id number]   Palliative Medicine Team at [facility] Progress Note    Subjective:  Patient in a good mood .  No distress.  States pain is well controlled this am.  Had some itching earlier and was "ugly with his family"  But currently behaving well.  Family notes he is not sleeping and is anxiously staying up all night and talking.  Patient remains with poor appetite.  He has not had dialysis in multiple days.  He does not appear to be volume overloaded or short of breath.  Filed Vitals:   08/15/11 0900  BP: 124/64  Pulse: 80  Temp:   Resp:    Physical exam:  Perrl, eomi, anicteric mmm Chest : decreased but clear CVS:  Regular, S1 and S2 Abd: obese soft not tender Ext:  Some swelling in the left greater than right hands , knee remains tender and swollen but not red Neuro:  Alert and oriented to self.  Being nice at the moment,  But did  Have some outburst of frustration earlier.  Assessment and plan: 78 year old white male with ESRD admitted with afib and worsening left knee pain. Patient has been on a declining course and has elected to stop dialysis. Patient becoming more agitated.  1. DNR/DNI Comfort care  2. Pain: Patient having difficulty objectively requesting treatment. Will change to scheduled doing small amounts with PRN meds for break through.   Seems to be working well. 3. Itching: Secondary to missing dialysis. Try to use topical meds. Benadryl ok but may increase his level of confusion will reevaluate if that is the case. Sarna also added. 4. Anxiety /Agitation: can try low dose prn ativan . Watch for paradoxical reaction. May need to add haldol if agitation escalates.   Patient in particular having trouble at night will add a scheduled dose of medications before bed time. 5. Disposition: Patient on the waiting list for Residential hospice placement.    Total time 15 min  Tayshun Gappa L. Ladona Ridgel, MD MBA The  Palliative Medicine Team at [facility] Phone: [phone] Pager: [phone]

## 2011-08-16 DIAGNOSIS — R197 Diarrhea, unspecified: Secondary | ICD-10-CM

## 2011-08-16 DIAGNOSIS — N186 End stage renal disease: Secondary | ICD-10-CM

## 2011-08-16 DIAGNOSIS — N19 Unspecified kidney failure: Secondary | ICD-10-CM

## 2011-08-16 DIAGNOSIS — R791 Abnormal coagulation profile: Secondary | ICD-10-CM

## 2011-08-16 MED ORDER — PANTOPRAZOLE SODIUM 40 MG PO TBEC: 40.0000 mg | DELAYED_RELEASE_TABLET | Freq: Every day | ORAL | Status: AC

## 2011-08-16 MED ORDER — HYDROCORTISONE 0.5 % EX CREA: TOPICAL_CREAM | Freq: Two times a day (BID) | CUTANEOUS | Status: AC

## 2011-08-16 MED ORDER — LIDOCAINE 5 % EX PTCH: 1.0000 | MEDICATED_PATCH | CUTANEOUS | Status: AC

## 2011-08-16 MED ORDER — LIDOCAINE 5 % EX PTCH: 1.0000 | MEDICATED_PATCH | CUTANEOUS | Status: DC

## 2011-08-16 MED ORDER — OXYCODONE HCL 20 MG/ML PO CONC: 5.0000 mg | ORAL | Status: DC

## 2011-08-16 MED ORDER — LORAZEPAM 0.5 MG PO TABS: 0.5000 mg | ORAL_TABLET | Freq: Four times a day (QID) | ORAL | Status: AC | PRN

## 2011-08-16 MED ORDER — OXYCODONE HCL 20 MG/ML PO CONC: 5.0000 mg | ORAL | Status: AC

## 2011-08-16 MED ORDER — CALCIUM CARBONATE ANTACID 500 MG PO CHEW: 1.0000 | CHEWABLE_TABLET | Freq: Two times a day (BID) | ORAL | Status: AC

## 2011-08-16 MED ORDER — LORAZEPAM 0.5 MG PO TABS: 0.5000 mg | ORAL_TABLET | Freq: Four times a day (QID) | ORAL | Status: DC | PRN

## 2011-08-16 NOTE — Progress Notes (Signed)
Patient d/c'd to [facility]. Transport by ambulance. Skin intact except as already charted. Pain controlled.

## 2011-08-16 NOTE — Progress Notes (Signed)
Clinical Social Worker facilitated pt discharge needs to [facility] including faxing pt discharge information and ensuring RN had phone number to call report, speaking with pt and pt family at bedside, and arranging ambulance transportation for pt to [facility]. No further social work needs at this time. Clinical Social Worker signing off.   Jacklynn Lewis, MSW, LCSWA  Clinical Social Work [phone]

## 2011-08-17 LAB — CULTURE, BLOOD (ROUTINE X 2)
Culture  Setup Time: 201305301635
Culture: NO GROWTH

## 2011-09-12 DEATH — deceased

## 2011-09-20 ENCOUNTER — Ambulatory Visit: Payer: Self-pay | Admitting: Pharmacist

## 2011-09-20 DIAGNOSIS — Z7901 Long term (current) use of anticoagulants: Secondary | ICD-10-CM

## 2011-09-20 DIAGNOSIS — I4891 Unspecified atrial fibrillation: Secondary | ICD-10-CM

## 2012-10-21 IMAGING — CR DG CHEST 2V
2 series · 2 of 2 positions shown · non-contrast
Comparison: Chest x-ray 01/07/2011.

CLINICAL DATA: Cough, chills and weakness.

CHEST - 2 VIEW

[w chest pa]
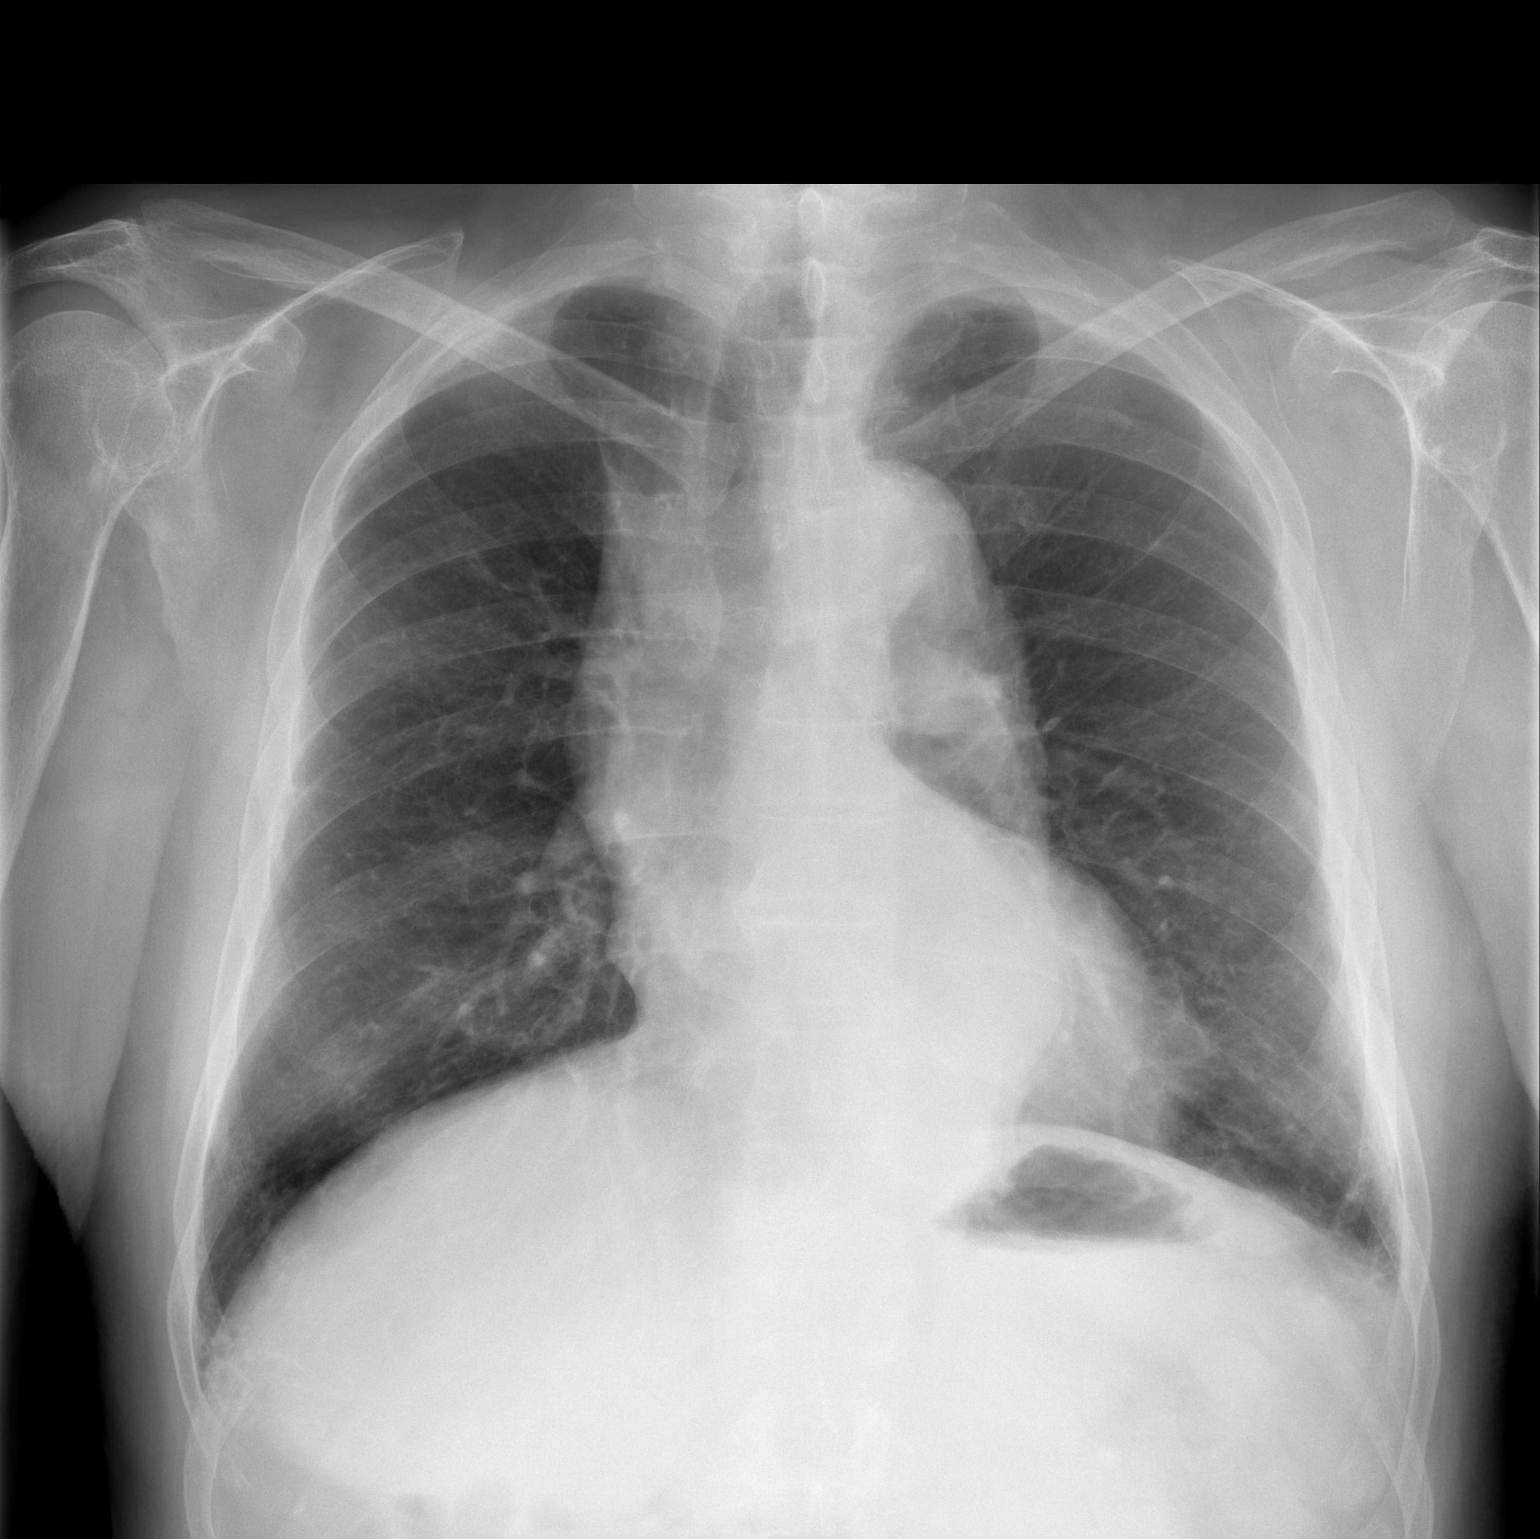

[w chest lat]
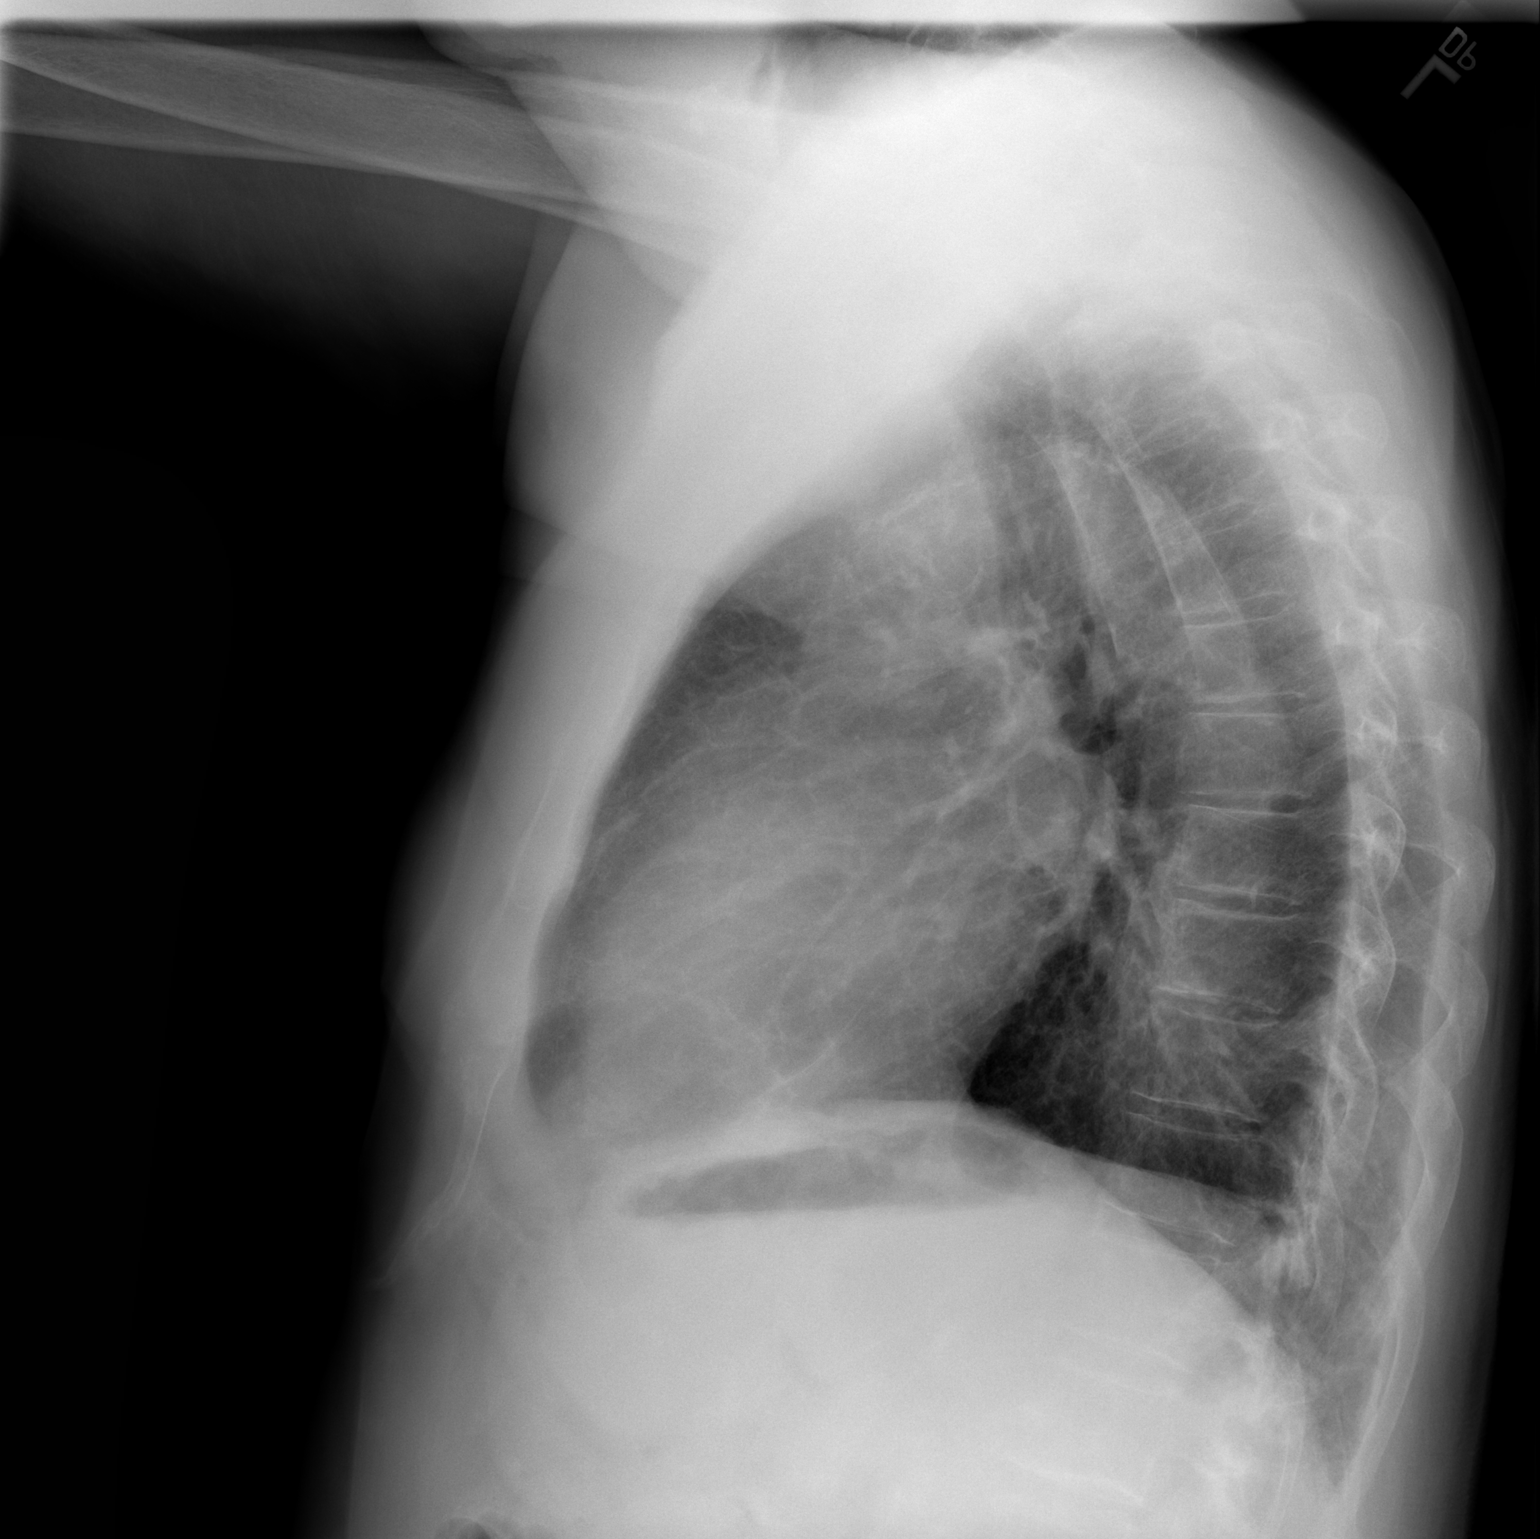

[2 of 2 positions shown; findings below may reference images not displayed]

FINDINGS: The cardiac silhouette, mediastinal and hilar contours
are within normal limits and stable.  There is tortuosity of the
thoracic aorta.  The lungs demonstrate mild chronic bronchitic type
changes but no infiltrates, edema or effusions.  No pneumothorax.
The bony thorax is intact.
IMPRESSION: Mild chronic bronchitic type lung changes but no acute pulmonary
findings.
